# Patient Record
Sex: Female | Born: 1990 | Race: Black or African American | Hispanic: No | Marital: Single | Smoking: Never smoker
Health system: Southern US, Community
[De-identification: ages and names within clinical notes are randomized; demographics above are authoritative.]

## PROBLEM LIST (undated history)

## (undated) DIAGNOSIS — D649 Anemia, unspecified: Secondary | ICD-10-CM

---

## 2017-11-21 ENCOUNTER — Other Ambulatory Visit: Payer: Self-pay

## 2017-11-21 NOTE — Telephone Encounter (Signed)
Pt called no answer LM via voicemail to see if she could come in early on Monday to do an U/S before her visit with JML. Pt was advised to call the office to make an appt for U/S.

## 2017-11-24 ENCOUNTER — Encounter: Payer: Self-pay | Admitting: Certified Nurse Midwife

## 2017-11-24 ENCOUNTER — Other Ambulatory Visit: Payer: Self-pay | Admitting: Certified Nurse Midwife

## 2017-11-24 ENCOUNTER — Other Ambulatory Visit (HOSPITAL_COMMUNITY)
Admission: RE | Admit: 2017-11-24 | Discharge: 2017-11-24 | Disposition: A | Discharge status: Home / Self Care | Payer: Self-pay | Source: Ambulatory Visit | Attending: Certified Nurse Midwife | Admitting: Certified Nurse Midwife

## 2017-11-24 ENCOUNTER — Ambulatory Visit (INDEPENDENT_AMBULATORY_CARE_PROVIDER_SITE_OTHER): Payer: Self-pay | Admitting: Certified Nurse Midwife

## 2017-11-24 ENCOUNTER — Ambulatory Visit (INDEPENDENT_AMBULATORY_CARE_PROVIDER_SITE_OTHER): Payer: Self-pay

## 2017-11-24 ENCOUNTER — Telehealth: Payer: Self-pay | Admitting: Certified Nurse Midwife

## 2017-11-24 VITALS — BP 120/77 | HR 83 | Ht 63.0 in | Wt 137.0 lb

## 2017-11-24 DIAGNOSIS — N926 Irregular menstruation, unspecified: Secondary | ICD-10-CM

## 2017-11-24 DIAGNOSIS — Z3491 Encounter for supervision of normal pregnancy, unspecified, first trimester: Secondary | ICD-10-CM | POA: Insufficient documentation

## 2017-11-24 DIAGNOSIS — Z3A11 11 weeks gestation of pregnancy: Secondary | ICD-10-CM

## 2017-11-24 DIAGNOSIS — O3680X0 Pregnancy with inconclusive fetal viability, not applicable or unspecified: Secondary | ICD-10-CM

## 2017-11-24 LAB — POCT URINALYSIS DIPSTICK
BILIRUBIN UA: NEGATIVE
Blood, UA: NEGATIVE
GLUCOSE UA: NEGATIVE
Ketones, UA: NEGATIVE
LEUKOCYTES UA: NEGATIVE
Nitrite, UA: NEGATIVE
Protein, UA: POSITIVE — AB
Spec Grav, UA: 1.015 (ref 1.010–1.025)
Urobilinogen, UA: 1 E.U./dL
pH, UA: 6 (ref 5.0–8.0)

## 2017-11-24 NOTE — Progress Notes (Signed)
NEW OB HISTORY AND PHYSICAL  SUBJECTIVE:       Teresa Wiggins is a 27 y.o. G2P0010 female, No LMP recorded. Patient is pregnant., Estimated Date of Delivery: 06/10/18, 537w5d, presents today for establishment of Prenatal Care.  She has no unusual complaints. Endorses nausea with weekly vomiting and breast tenderness. Denies difficulty breathing or respiratory distress, chest pain, abdominal pain, vaginal bleeding, dysuria, and leg pain or swelling.   She is taking a prenatal visit. Desires genetic screening.    Gynecologic History  No LMP recorded. Patient is pregnant.  Last Pap: unsure.  Obstetric History  OB History  Gravida Para Term Preterm AB Living  2       1    SAB TAB Ectopic Multiple Live Births    1          # Outcome Date GA Lbr Len/2nd Weight Sex Delivery Anes PTL Lv  2 Current           1 TAB             History reviewed. No pertinent past medical history.  History reviewed. No pertinent surgical history.   Social History   Socioeconomic History  . Marital status: Single    Spouse name: Not on file  . Number of children: Not on file  . Years of education: Not on file  . Highest education level: Not on file  Occupational History  . Not on file  Social Needs  . Financial resource strain: Not on file  . Food insecurity:    Worry: Not on file    Inability: Not on file  . Transportation needs:    Medical: Not on file    Non-medical: Not on file  Tobacco Use  . Smoking status: Never Smoker  . Smokeless tobacco: Never Used  Substance and Sexual Activity  . Alcohol use: Never    Frequency: Never  . Drug use: Never  . Sexual activity: Yes    Birth control/protection: None  Lifestyle  . Physical activity:    Days per week: Not on file    Minutes per session: Not on file  . Stress: Not on file  Relationships  . Social connections:    Talks on phone: Not on file    Gets together: Not on file    Attends religious service: Not on file    Active  member of club or organization: Not on file    Attends meetings of clubs or organizations: Not on file    Relationship status: Not on file  . Intimate partner violence:    Fear of current or ex partner: Not on file    Emotionally abused: Not on file    Physically abused: Not on file    Forced sexual activity: Not on file  Other Topics Concern  . Not on file  Social History Narrative  . Not on file    History reviewed. No pertinent family history.  The following portions of the patient's history were reviewed and updated as appropriate: allergies, current medications, past OB history, past medical history, past surgical history, past family history, past social history, and problem list.   OBJECTIVE:  BP 120/77   Pulse 83   Ht 5\' 3"  (1.6 m)   Wt 137 lb (62.1 kg)   BMI 24.27 kg/m   Initial Physical Exam (New OB)  GENERAL APPEARANCE: alert, well appearing, in no apparent distress  HEAD: normocephalic, atraumatic  MOUTH: mucous membranes moist, pharynx normal without lesions  THYROID: no thyromegaly or masses present  BREASTS: no masses noted, no significant tenderness, no palpable axillary nodes, no skin changes  LUNGS: clear to auscultation, no wheezes, rales or rhonchi, symmetric air entry  HEART: regular rate and rhythm, no murmurs  ABDOMEN: soft, nontender, nondistended, no abnormal masses, no epigastric pain  EXTREMITIES: no redness or tenderness in the calves or thighs, no edema  SKIN: normal coloration and turgor, no rashes  LYMPH NODES: no adenopathy palpable  NEUROLOGIC: alert, oriented, normal speech, no focal findings or movement disorder noted  PELVIC EXAM EXTERNAL GENITALIA: normal appearing vulva with no masses, tenderness or lesions VAGINA: no abnormal discharge or lesions CERVIX: no lesions or cervical motion tenderness and Pap collected UTERUS: gravid and consistent with 11 weeks ADNEXA: no masses palpable and nontender OB EXAM PELVIMETRY:  appears adequate  ULTRASOUND REPORT  Location: [facility] Date of Service:  11/24/2017  Indications: Dating/Viability Findings:  Mason Jim intrauterine pregnancy is visualized with a CRL consistent with 11 5/[redacted] weeks gestation, giving an (U/S) EDD of 06/10/18. A clinical EDD has not been established.  FHR: 152 BPM CRL measurement: 49.5 mm Yolk sac and early anatomy is normal.  Placenta visualized and appears to be anterior.  Right Ovary measures 3.5 x 2.4 x 2.2 cm. It is normal in appearance. Left Ovary measures 2.8 x 2.3 x 1.6 cm. It is normal appearance. There is no obvious evidence of a corpus luteal cyst. Survey of the adnexa demonstrates no adnexal masses. There is no free peritoneal fluid in the cul de sac.  Impression: 1. 11 5/7 week Viable Singleton Intrauterine pregnancy by U/S. 2. A clinical EDD has not been established, so today's ultrasound should be used.  Recommendations: 1.Clinical correlation with the patient's History and Physical Exam. 2. Today's ultrasound should be used to establish EDD of 06/10/18.  ASSESSMENT: Normal pregnancy Nausea and vomiting in pregnancy Desires genetic screening  PLAN: Prenatal care [place] samples given New OB counseling: The patient has been given an overview regarding routine prenatal care. Recommendations regarding diet, weight gain, and exercise in pregnancy were given. Prenatal testing, optional genetic testing, and ultrasound use in pregnancy were reviewed.  Benefits of Breast Feeding were discussed. The patient is encouraged to consider nursing her baby post partum. See orders   Gunnar Bulla, CNM [facility], [facility]

## 2017-11-24 NOTE — Telephone Encounter (Signed)
The patient came into the office and stated that she missed work due to her being extremely nauseous, Her employer needs a note for missed time out. "If possible the patient would like a note faxed to the human resources department of her employer. That fax number is [phone]. The patient would also like a call back as well. Please advise.

## 2017-11-24 NOTE — Patient Instructions (Signed)
Vaginal Bleeding During Pregnancy, First Trimester A small amount of bleeding (spotting) from the vagina is common in early pregnancy. Sometimes the bleeding is normal and is not a problem, and sometimes it is a sign of something serious. Be sure to tell your doctor about any bleeding from your vagina right away. Follow these instructions at home:  Watch your condition for any changes.  Follow your doctor's instructions about how active you can be.  If you are on bed rest: ? You may need to stay in bed and only get up to use the bathroom. ? You may be allowed to do some activities. ? If you need help, make plans for someone to help you.  Write down: ? The number of pads you use each day. ? How often you change pads. ? How soaked (saturated) your pads are.  Do not use tampons.  Do not douche.  Do not have sex or orgasms until your doctor says it is okay.  If you pass any tissue from your vagina, save the tissue so you can show it to your doctor.  Only take medicines as told by your doctor.  Do not take aspirin because it can make you bleed.  Keep all follow-up visits as told by your doctor. Contact a doctor if:  You bleed from your vagina.  You have cramps.  You have labor pains.  You have a fever that does not go away after you take medicine. Get help right away if:  You have very bad cramps in your back or belly (abdomen).  You pass large clots or tissue from your vagina.  You bleed more.  You feel light-headed or weak.  You pass out (faint).  You have chills.  You are leaking fluid or have a gush of fluid from your vagina.  You pass out while pooping (having a bowel movement). This information is not intended to replace advice given to you by your health care provider. Make sure you discuss any questions you have with your health care provider. Document Released: 09/27/2013 Document Revised: 10/19/2015 Document Reviewed: 01/18/2013 Elsevier Interactive  Patient Education  2018 [place]. Abdominal Pain During Pregnancy Belly (abdominal) pain is common during pregnancy. Most of the time, it is not a serious problem. Other times, it can be a sign that something is wrong with the pregnancy. Always tell your doctor if you have belly pain. Follow these instructions at home: Monitor your belly pain for any changes. The following actions may help you feel better:  Do not have sex (intercourse) or put anything in your vagina until you feel better.  Rest until your pain stops.  Drink clear fluids if you feel sick to your stomach (nauseous). Do not eat solid food until you feel better.  Only take medicine as told by your doctor.  Keep all doctor visits as told.  Get help right away if:  You are bleeding, leaking fluid, or pieces of tissue come out of your vagina.  You have more pain or cramping.  You keep throwing up (vomiting).  You have pain when you pee (urinate) or have blood in your pee.  You have a fever.  You do not feel your baby moving as much.  You feel very weak or feel like passing out.  You have trouble breathing, with or without belly pain.  You have a very bad headache and belly pain.  You have fluid leaking from your vagina and belly pain.  You keep having watery  poop (diarrhea).  Your belly pain does not go away after resting, or the pain gets worse. This information is not intended to replace advice given to you by your health care provider. Make sure you discuss any questions you have with your health care provider. Document Released: 05/01/2009 Document Revised: 12/20/2015 Document Reviewed: 12/10/2012 Elsevier Interactive Patient Education  2018 [place]. Back Pain in Pregnancy Back pain during pregnancy is common. Back pain may be caused by several factors that are related to changes during your pregnancy. Follow these instructions at home: Managing pain, stiffness, and swelling  If directed,  apply ice for sudden (acute) back pain. ? Put ice in a plastic bag. ? Place a towel between your skin and the bag. ? Leave the ice on for 20 minutes, 2-3 times per day.  If directed, apply heat to the affected area before you exercise: ? Place a towel between your skin and the heat pack or heating pad. ? Leave the heat on for 20-30 minutes. ? Remove the heat if your skin turns bright red. This is especially important if you are unable to feel pain, heat, or cold. You may have a greater risk of getting burned. Activity  Exercise as told by your health care provider. Exercising is the best way to prevent or manage back pain.  Listen to your body when lifting. If lifting hurts, ask for help or bend your knees. This uses your leg muscles instead of your back muscles.  Squat down when picking up something from the floor. Do not bend over.  Only use bed rest as told by your health care provider. Bed rest should only be used for the most severe episodes of back pain. Standing, Sitting, and Lying Down  Do not stand in one place for long periods of time.  Use good posture when sitting. Make sure your head rests over your shoulders and is not hanging forward. Use a pillow on your lower back if necessary.  Try sleeping on your side, preferably the left side, with a pillow or two between your legs. If you are sore after a night's rest, your bed may be too soft. A firm mattress may provide more support for your back during pregnancy. General instructions  Do not wear high heels.  Eat a healthy diet. Try to gain weight within your health care provider's recommendations.  Use a maternity girdle, elastic sling, or back brace as told by your health care provider.  Take over-the-counter and prescription medicines only as told by your health care provider.  Keep all follow-up visits as told by your health care provider. This is important. This includes any visits with any specialists, such as a  physical therapist. Contact a health care provider if:  Your back pain interferes with your daily activities.  You have increasing pain in other parts of your body. Get help right away if:  You develop numbness, tingling, weakness, or problems with the use of your arms or legs.  You develop severe back pain that is not controlled with medicine.  You have a sudden change in bowel or bladder control.  You develop shortness of breath, dizziness, or you faint.  You develop nausea, vomiting, or sweating.  You have back pain that is a rhythmic, cramping pain similar to labor pains. Labor pain is usually 1-2 minutes apart, lasts for about 1 minute, and involves a bearing down feeling or pressure in your pelvis.  You have back pain and your water breaks or  you have vaginal bleeding.  You have back pain or numbness that travels down your leg.  Your back pain developed after you fell.  You develop pain on one side of your back.  You see blood in your urine.  You develop skin blisters in the area of your back pain. This information is not intended to replace advice given to you by your health care provider. Make sure you discuss any questions you have with your health care provider. Document Released: 08/21/2005 Document Revised: 10/19/2015 Document Reviewed: 01/25/2015 Elsevier Interactive Patient Education  2018 [place]. WHAT OB PATIENTS CAN EXPECT   Confirmation of pregnancy and ultrasound ordered if medically indicated-[redacted] weeks gestation  New OB (NOB) intake with nurse and New OB (NOB) labs- [redacted] weeks gestation  New OB (NOB) physical examination with provider- 11/[redacted] weeks gestation  Flu vaccine-[redacted] weeks gestation  Anatomy scan-[redacted] weeks gestation  Glucose tolerance test, blood work to test for anemia, T-dap vaccine-[redacted] weeks gestation  Vaginal swabs/cultures-STD/Group B strep-[redacted] weeks gestation  Appointments every 4 weeks until 28 weeks  Every 2 weeks from 28 weeks  until 36 weeks  Weekly visits from 36 weeks until delivery  Morning Sickness Morning sickness is when you feel sick to your stomach (nauseous) during pregnancy. You may feel sick to your stomach and throw up (vomit). You may feel sick in the morning, but you can feel this way any time of day. Some women feel very sick to their stomach and cannot stop throwing up (hyperemesis gravidarum). Follow these instructions at home:  Only take medicines as told by your doctor.  Take multivitamins as told by your doctor. Taking multivitamins before getting pregnant can stop or lessen the harshness of morning sickness.  Eat dry toast or unsalted crackers before getting out of bed.  Eat 5 to 6 small meals a day.  Eat dry and bland foods like rice and baked potatoes.  Do not drink liquids with meals. Drink between meals.  Do not eat greasy, fatty, or spicy foods.  Have someone cook for you if the smell of food causes you to feel sick or throw up.  If you feel sick to your stomach after taking prenatal vitamins, take them at night or with a snack.  Eat protein when you need a snack (nuts, yogurt, cheese).  Eat unsweetened gelatins for dessert.  Wear a bracelet used for sea sickness (acupressure wristband).  Go to a doctor that puts thin needles into certain body points (acupuncture) to improve how you feel.  Do not smoke.  Use a humidifier to keep the air in your house free of odors.  Get lots of fresh air. Contact a doctor if:  You need medicine to feel better.  You feel dizzy or lightheaded.  You are losing weight. Get help right away if:  You feel very sick to your stomach and cannot stop throwing up.  You pass out (faint). This information is not intended to replace advice given to you by your health care provider. Make sure you discuss any questions you have with your health care provider. Document Released: 06/20/2004 Document Revised: 10/19/2015 Document Reviewed:  10/28/2012 Elsevier Interactive Patient Education  2017 [place] for Pregnant Women While you are pregnant, your body will require additional nutrition to help support your growing baby. It is recommended that you consume:  150 additional calories each day during your first trimester.  300 additional calories each day during your second trimester.  300 additional calories each  day during your third trimester.  Eating a healthy, well-balanced diet is very important for your health and for your baby's health. You also have a higher need for some vitamins and minerals, such as folic acid, calcium, iron, and vitamin D. What do I need to know about eating during pregnancy?  Do not try to lose weight or go on a diet during pregnancy.  Choose healthy, nutritious foods. Choose  of a sandwich with a glass of milk instead of a candy bar or a high-calorie sugar-sweetened beverage.  Limit your overall intake of foods that have "empty calories." These are foods that have little nutritional value, such as sweets, desserts, candies, sugar-sweetened beverages, and fried foods.  Eat a variety of foods, especially fruits and vegetables.  Take a prenatal vitamin to help meet the additional needs during pregnancy, specifically for folic acid, iron, calcium, and vitamin D.  Remember to stay active. Ask your health care provider for exercise recommendations that are specific to you.  Practice good food safety and cleanliness, such as washing your hands before you eat and after you prepare raw meat. This helps to prevent foodborne illnesses, such as listeriosis, that can be very dangerous for your baby. Ask your health care provider for more information about listeriosis. What does 150 extra calories look like? Healthy options for an additional 150 calories each day could be any of the following:  Plain low-fat yogurt (6-8 oz) with  cup of berries.  1 apple with 2 teaspoons of peanut  butter.  Cut-up vegetables with  cup of hummus.  Low-fat chocolate milk (8 oz or 1 cup).  1 string cheese with 1 medium orange.   of a peanut butter and jelly sandwich on whole-wheat bread (1 tsp of peanut butter).  For 300 calories, you could eat two of those healthy options each day. What is a healthy amount of weight to gain? The recommended amount of weight for you to gain is based on your pre-pregnancy BMI. If your pre-pregnancy BMI was:  Less than 18 (underweight), you should gain 28-40 lb.  18-24.9 (normal), you should gain 25-35 lb.  25-29.9 (overweight), you should gain 15-25 lb.  Greater than 30 (obese), you should gain 11-20 lb.  What if I am having twins or multiples? Generally, pregnant women who will be having twins or multiples may need to increase their daily calories by 300-600 calories each day. The recommended range for total weight gain is 25-54 lb, depending on your pre-pregnancy BMI. Talk with your health care provider for specific guidance about additional nutritional needs, weight gain, and exercise during your pregnancy. What foods can I eat? Grains Any grains. Try to choose whole grains, such as whole-wheat bread, oatmeal, or brown rice. Vegetables Any vegetables. Try to eat a variety of colors and types of vegetables to get a full range of vitamins and minerals. Remember to wash your vegetables well before eating. Fruits Any fruits. Try to eat a variety of colors and types of fruit to get a full range of vitamins and minerals. Remember to wash your fruits well before eating. Meats and Other Protein Sources Lean meats, including chicken, [place], fish, and lean cuts of beef, veal, or pork. Make sure that all meats are cooked to "well done." Tofu. Tempeh. Beans. Eggs. Peanut butter and other nut butters. Seafood, such as shrimp, crab, and lobster. If you choose fish, select types that are higher in omega-3 fatty acids, including salmon, herring, mussels,  trout, sardines, and pollock. Make  sure that all meats are cooked to food-safe temperatures. Dairy Pasteurized milk and milk alternatives. Pasteurized yogurt and pasteurized cheese. Cottage cheese. Sour cream. Beverages Water. Juices that contain 100% fruit juice or vegetable juice. Caffeine-free teas and decaffeinated coffee. Drinks that contain caffeine are okay to drink, but it is better to avoid caffeine. Keep your total caffeine intake to less than 200 mg each day (12 oz of coffee, tea, or soda) or as directed by your health care provider. Condiments Any pasteurized condiments. Sweets and Desserts Any sweets and desserts. Fats and Oils Any fats and oils. The items listed above may not be a complete list of recommended foods or beverages. Contact your dietitian for more options. What foods are not recommended? Vegetables Unpasteurized (raw) vegetable juices. Fruits Unpasteurized (raw) fruit juices. Meats and Other Protein Sources Cured meats that have nitrates, such as bacon, salami, and hotdogs. Luncheon meats, bologna, or other deli meats (unless they are reheated until they are steaming hot). Refrigerated pate, meat spreads from a meat counter, smoked seafood that is found in the refrigerated section of a store. Raw fish, such as sushi or sashimi. High mercury content fish, such as tilefish, shark, swordfish, and king mackerel. Raw meats, such as tuna or beef tartare. Undercooked meats and poultry. Make sure that all meats are cooked to food-safe temperatures. Dairy Unpasteurized (raw) milk and any foods that have raw milk in them. Soft cheeses, such as feta, queso blanco, queso fresco, Brie, Camembert cheeses, blue-veined cheeses, and Panela cheese (unless it is made with pasteurized milk, which must be stated on the label). Beverages Alcohol. Sugar-sweetened beverages, such as sodas, teas, or energy drinks. Condiments Homemade fermented foods and drinks, such as pickles, sauerkraut,  or kombucha drinks. (Store-bought pasteurized versions of these are okay.) Other Salads that are made in the store, such as ham salad, chicken salad, egg salad, tuna salad, and seafood salad. The items listed above may not be a complete list of foods and beverages to avoid. Contact your dietitian for more information. This information is not intended to replace advice given to you by your health care provider. Make sure you discuss any questions you have with your health care provider. Document Released: 02/25/2014 Document Revised: 10/19/2015 Document Reviewed: 10/26/2013 Elsevier Interactive Patient Education  2018 [employer]. Common Medications Safe in Pregnancy  Acne:      Constipation:  Benzoyl Peroxide     Colace  Clindamycin      Dulcolax Suppository  Topica Erythromycin     Fibercon  Salicylic Acid      Metamucil         Miralax AVOID:        Senakot   Accutane    Cough:  Retin-A       Cough Drops  Tetracycline      Phenergan w/ Codeine if Rx  Minocycline      Robitussin (Plain & DM)  Antibiotics:     Crabs/Lice:  Ceclor       RID  Cephalosporins    AVOID:  E-Mycins      Kwell  Keflex  Macrobid/Macrodantin   Diarrhea:  Penicillin      Kao-Pectate  Zithromax      Imodium AD         PUSH FLUIDS AVOID:       Cipro     Fever:  Tetracycline      Tylenol (Regular or Extra  Minocycline       Strength)  Levaquin  Extra Strength-Do not          Exceed 8 tabs/24 hrs Caffeine:        <250m/day (equiv. To 1 cup of coffee or  approx. 3 12 oz sodas)         Gas: Cold/Hayfever:       Gas-X  Benadryl      Mylicon  Claritin       Phazyme  **Claritin-D        Chlor-Trimeton    Headaches:  Dimetapp      ASA-Free Excedrin  Drixoral-Non-Drowsy     Cold Compress  Mucinex (Guaifenasin)     Tylenol (Regular or Extra  Sudafed/Sudafed-12 Hour     Strength)  **Sudafed PE Pseudoephedrine   Tylenol Cold & Sinus     Vicks Vapor Rub  Zyrtec  **AVOID if Problems With Blood  Pressure         Heartburn: Avoid lying down for at least 1 hour after meals  Aciphex      Maalox     Rash:  Milk of Magnesia     Benadryl    Mylanta       1% Hydrocortisone Cream  Pepcid  Pepcid Complete   Sleep Aids:  Prevacid      Ambien   Prilosec       Benadryl  Rolaids       Chamomile Tea  Tums (Limit 4/day)     Unisom  Zantac       Tylenol PM         Warm milk-add vanilla or  Hemorrhoids:       Sugar for taste  Anusol/Anusol H.C.  (RX: Analapram 2.5%)  Sugar Substitutes:  Hydrocortisone OTC     Ok in moderation  Preparation H      Tucks        Vaseline lotion applied to tissue with wiping    Herpes:     Throat:  Acyclovir      Oragel  Famvir  Valtrex     Vaccines:         Flu Shot Leg Cramps:       *Gardasil  Benadryl      Hepatitis A         Hepatitis B Nasal Spray:       Pneumovax  Saline Nasal Spray     Polio Booster         Tetanus Nausea:       Tuberculosis test or PPD  Vitamin B6 25 mg TID   AVOID:    Dramamine      *Gardasil  Emetrol       Live Poliovirus  Ginger Root 250 mg QID    MMR (measles, mumps &  High Complex Carbs @ Bedtime    rebella)  Sea Bands-Accupressure    Varicella (Chickenpox)  Unisom 1/2 tab TID     *No known complications           If received before Pain:         Known pregnancy;   Darvocet       Resume series after  Lortab        Delivery  Percocet    Yeast:   Tramadol      Femstat  Tylenol 3      Gyne-lotrimin  Ultram       Monistat  Vicodin           MISC:  All Sunscreens           Hair Coloring/highlights          Insect Repellant's          (Including DEET)         Mystic Tans

## 2017-11-24 NOTE — Progress Notes (Signed)
Pt is present today for confirmation of pregnancy. U/S dating done today [id number].  Teresa Wiggins presents for NOB nurse intake visit. Pregnancy confirmation done at [facility],November 24, 2017, with JML.  G2.  P0010.  LMP unknown.  EDD Jun 10, 2018.  [place]. Pregnancy education material explained and given.  1 cats in the home.  NOB labs ordered. BMI less than 30. Sickle cell order due to race. HIV and drug screen explained and ordered/declined. Genetic screening discussed. Genetic testing; Ordered. Pt to discuss genetic testing with provider. PNV encouraged. Pt to follow up with provider in 4 weeks for ROB.

## 2017-11-25 ENCOUNTER — Encounter: Payer: Self-pay | Admitting: Certified Nurse Midwife

## 2017-11-25 DIAGNOSIS — Z832 Family history of diseases of the blood and blood-forming organs and certain disorders involving the immune mechanism: Secondary | ICD-10-CM | POA: Insufficient documentation

## 2017-11-25 LAB — URINALYSIS, ROUTINE W REFLEX MICROSCOPIC
Bilirubin, UA: NEGATIVE
GLUCOSE, UA: NEGATIVE
KETONES UA: NEGATIVE
Leukocytes, UA: NEGATIVE
NITRITE UA: NEGATIVE
PROTEIN UA: NEGATIVE
RBC, UA: NEGATIVE
SPEC GRAV UA: 1.02 (ref 1.005–1.030)
Urobilinogen, Ur: 1 mg/dL (ref 0.2–1.0)
pH, UA: 6 (ref 5.0–7.5)

## 2017-11-25 LAB — CBC WITH DIFFERENTIAL/PLATELET
BASOS: 0 %
Basophils Absolute: 0 10*3/uL (ref 0.0–0.2)
EOS (ABSOLUTE): 0 10*3/uL (ref 0.0–0.4)
EOS: 1 %
Hematocrit: 35.2 % (ref 34.0–46.6)
Hemoglobin: 12 g/dL (ref 11.1–15.9)
IMMATURE GRANS (ABS): 0 10*3/uL (ref 0.0–0.1)
Immature Granulocytes: 0 %
LYMPHS: 15 %
Lymphocytes Absolute: 1.3 10*3/uL (ref 0.7–3.1)
MCH: 26.5 pg — ABNORMAL LOW (ref 26.6–33.0)
MCHC: 34.1 g/dL (ref 31.5–35.7)
MCV: 78 fL — AB (ref 79–97)
Monocytes Absolute: 0.7 10*3/uL (ref 0.1–0.9)
Monocytes: 9 %
NEUTROS PCT: 75 %
Neutrophils Absolute: 6.5 10*3/uL (ref 1.4–7.0)
PLATELETS: 243 10*3/uL (ref 150–450)
RBC: 4.52 x10E6/uL (ref 3.77–5.28)
RDW: 13.1 % (ref 12.3–15.4)
WBC: 8.6 10*3/uL (ref 3.4–10.8)

## 2017-11-25 LAB — ABO AND RH: Rh Factor: POSITIVE

## 2017-11-25 LAB — TOXOPLASMA ANTIBODIES- IGG AND  IGM: Toxoplasma IgG Ratio: 68.5 IU/mL — ABNORMAL HIGH (ref 0.0–7.1)

## 2017-11-25 LAB — BETA HCG QUANT (REF LAB): hCG Quant: 199989 m[IU]/mL

## 2017-11-25 LAB — VARICELLA ZOSTER ANTIBODY, IGG: VARICELLA: 368 {index} (ref >165)

## 2017-11-25 LAB — RUBELLA SCREEN: RUBELLA: 1.98 {index} (ref >0.99)

## 2017-11-25 LAB — HEPATITIS B SURFACE ANTIGEN: HEP B S AG: NEGATIVE

## 2017-11-25 LAB — RPR: RPR Ser Ql: NONREACTIVE

## 2017-11-25 LAB — HIV ANTIBODY (ROUTINE TESTING W REFLEX): HIV SCREEN 4TH GENERATION: NONREACTIVE

## 2017-11-25 LAB — SICKLE CELL SCREEN: Sickle Cell Screen: POSITIVE — AB

## 2017-11-25 NOTE — Progress Notes (Signed)
Please contact patient. FOB will need sickle cell labs as well. Still awaiting rest of NOB labs. Encourage to activate MyChart. Thanks, JML

## 2017-11-26 ENCOUNTER — Telehealth: Payer: Self-pay

## 2017-11-26 LAB — DRUG PROFILE, UR, 9 DRUGS (LABCORP)
AMPHETAMINES, URINE: NEGATIVE ng/mL
BENZODIAZEPINE QUANT UR: NEGATIVE ng/mL
Barbiturate Quant, Ur: NEGATIVE ng/mL
CANNABINOID QUANT UR: NEGATIVE ng/mL
Cocaine (Metab.): NEGATIVE ng/mL
METHADONE SCREEN, URINE: NEGATIVE ng/mL
Opiate Quant, Ur: NEGATIVE ng/mL
PCP QUANT UR: NEGATIVE ng/mL
Propoxyphene: NEGATIVE ng/mL

## 2017-11-26 LAB — NICOTINE SCREEN, URINE: COTININE UR QL SCN: NEGATIVE ng/mL

## 2017-11-26 LAB — CYTOLOGY - PAP
CHLAMYDIA, DNA PROBE: NEGATIVE
DIAGNOSIS: NEGATIVE
Neisseria Gonorrhea: NEGATIVE

## 2017-11-26 NOTE — Telephone Encounter (Signed)
Message sent to ML.

## 2017-11-27 LAB — URINE CULTURE, OB REFLEX

## 2017-11-27 LAB — CULTURE, OB URINE

## 2017-12-01 ENCOUNTER — Telehealth: Payer: Self-pay | Admitting: Certified Nurse Midwife

## 2017-12-01 NOTE — Telephone Encounter (Signed)
May have a note this time. Patient must contact office immediately next time to receive note. Encourage to apply for FMLA if available. Thanks, JML

## 2017-12-01 NOTE — Progress Notes (Signed)
Please contact Pap is negative or normal. Encourage to activate MyChart. Thanks, JML

## 2017-12-01 NOTE — Telephone Encounter (Signed)
The patient called and stated that she would like to know the cost of the (Sickle Cell Testing) for the baby. No other information was disclosed. Please advise.

## 2017-12-02 ENCOUNTER — Encounter: Payer: Self-pay | Admitting: *Deleted

## 2017-12-02 NOTE — Telephone Encounter (Signed)
Sent pt mychart message

## 2017-12-07 ENCOUNTER — Other Ambulatory Visit: Payer: Self-pay

## 2017-12-07 DIAGNOSIS — O210 Mild hyperemesis gravidarum: Secondary | ICD-10-CM | POA: Insufficient documentation

## 2017-12-07 DIAGNOSIS — Z3A13 13 weeks gestation of pregnancy: Secondary | ICD-10-CM | POA: Insufficient documentation

## 2017-12-07 LAB — CBC
HCT: 36 % (ref 35.0–47.0)
HEMOGLOBIN: 12.4 g/dL (ref 12.0–16.0)
MCH: 26.8 pg (ref 26.0–34.0)
MCHC: 34.3 g/dL (ref 32.0–36.0)
MCV: 78.2 fL — ABNORMAL LOW (ref 80.0–100.0)
Platelets: 243 10*3/uL (ref 150–440)
RBC: 4.61 MIL/uL (ref 3.80–5.20)
RDW: 12.5 % (ref 11.5–14.5)
WBC: 11.3 10*3/uL — ABNORMAL HIGH (ref 3.6–11.0)

## 2017-12-07 LAB — BASIC METABOLIC PANEL
ANION GAP: 12 (ref 5–15)
BUN: 8 mg/dL (ref 6–20)
CALCIUM: 9.8 mg/dL (ref 8.9–10.3)
CO2: 22 mmol/L (ref 22–32)
Chloride: 102 mmol/L (ref 98–111)
Creatinine, Ser: 0.58 mg/dL (ref 0.44–1.00)
GFR calc Af Amer: >60 mL/min (ref >60)
GFR calc non Af Amer: >60 mL/min (ref >60)
GLUCOSE: 114 mg/dL — AB (ref 70–99)
Potassium: 3.3 mmol/L — ABNORMAL LOW (ref 3.5–5.1)
Sodium: 136 mmol/L (ref 135–145)

## 2017-12-07 LAB — LIPASE, BLOOD: Lipase: 36 U/L (ref 11–51)

## 2017-12-07 NOTE — ED Notes (Signed)
Pt found to be eating ice chips upon rounding, pt tolerating well.

## 2017-12-07 NOTE — ED Triage Notes (Signed)
Patient reports vomiting x 8 today and being unable to keep anything down.  Also reports feeling dizzy.

## 2017-12-08 ENCOUNTER — Emergency Department
Admission: EM | Admit: 2017-12-08 | Discharge: 2017-12-08 | Disposition: A | Discharge status: Home / Self Care | Payer: Self-pay | Attending: Emergency Medicine | Admitting: Emergency Medicine

## 2017-12-08 ENCOUNTER — Emergency Department: Payer: Self-pay

## 2017-12-08 DIAGNOSIS — O210 Mild hyperemesis gravidarum: Secondary | ICD-10-CM

## 2017-12-08 LAB — URINALYSIS, COMPLETE (UACMP) WITH MICROSCOPIC
Bilirubin Urine: NEGATIVE
GLUCOSE, UA: NEGATIVE mg/dL
Hgb urine dipstick: NEGATIVE
Ketones, ur: 80 mg/dL — AB
Nitrite: NEGATIVE
PROTEIN: 30 mg/dL — AB
Specific Gravity, Urine: 1.017 (ref 1.005–1.030)
pH: 5 (ref 5.0–8.0)

## 2017-12-08 LAB — HCG, QUANTITATIVE, PREGNANCY: HCG, BETA CHAIN, QUANT, S: 247904 m[IU]/mL — AB (ref <5)

## 2017-12-08 LAB — POCT PREGNANCY, URINE: PREG TEST UR: POSITIVE — AB

## 2017-12-08 MED ORDER — SODIUM CHLORIDE 0.9 % IV BOLUS
1000.0000 mL | Freq: Once | INTRAVENOUS | Status: AC
  Administered 2017-12-08: 1000 mL via INTRAVENOUS

## 2017-12-08 MED ORDER — ONDANSETRON HCL 4 MG/2ML IJ SOLN
4.0000 mg | Freq: Once | INTRAMUSCULAR | Status: AC
  Administered 2017-12-08: 4 mg via INTRAVENOUS
  Filled 2017-12-08: qty 2

## 2017-12-08 MED ORDER — ONDANSETRON 4 MG PO TBDP: 4.0000 mg | ORAL_TABLET | Freq: Three times a day (TID) | ORAL | 0 refills | Status: DC | PRN

## 2017-12-08 NOTE — ED Notes (Signed)
Pt up to desk, says she knows she can't eat or drink anything but she'd like jello; explained to pt that jello is food and she can't have anything until cleared by provider; pt verbalized understanding;

## 2017-12-08 NOTE — ED Provider Notes (Addendum)
[facility] Emergency Department Provider Note _   First MD Initiated Contact with Patient 12/08/17 [phone]     (approximate)  I have reviewed the triage vital signs and the nursing notes.   HISTORY  Chief Complaint Emesis and Dizziness   HPI Teresa Wiggins is a 27 y.o. female G3P02 elective abortions presents to the emergency department with nausea vomiting and inability to tolerate p.o. x1 day.  Patient denies any abdominal pain no pelvic pain no vaginal bleeding.  Patient denies any vaginal discharge.  Patient states that she was prescribed a "nausea medicine" from her OB without any improvement of nausea.   Past medical history 2 elective abortions  Patient Active Problem List   Diagnosis Date Noted  . Family history of sickle cell trait in mother 11/25/2017    No past surgical history on file.  Prior to Admission medications   Not on File    Allergies No known drug allergies No family history on file.  Social History Social History   Tobacco Use  . Smoking status: Never Smoker  . Smokeless tobacco: Never Used  Substance Use Topics  . Alcohol use: Never    Frequency: Never  . Drug use: Never    Review of Systems Constitutional: No fever/chills Eyes: No visual changes. ENT: No sore throat. Cardiovascular: Denies chest pain. Respiratory: Denies shortness of breath. Gastrointestinal: No abdominal pain.  Positive for nausea and vomiting no diarrhea.  No constipation. Genitourinary: Negative for dysuria. Musculoskeletal: Negative for neck pain.  Negative for back pain. Integumentary: Negative for rash. Neurological: Negative for headaches, focal weakness or numbness.   ____________________________________________   PHYSICAL EXAM:  VITAL SIGNS: ED Triage Vitals  Enc Vitals Group     BP 12/07/17 1920 119/72     Pulse Rate 12/07/17 1920 (!) 114     Resp 12/07/17 1920 20     Temp 12/07/17 1920 97.9 F (36.6 C)     Temp  Source 12/07/17 2359 Oral     SpO2 12/07/17 1920 100 %     Weight --      Height --      Head Circumference --      Peak Flow --      Pain Score 12/07/17 1921 0     Pain Loc --      Pain Edu? --      Excl. in GC? --     Constitutional: Alert and oriented. Well appearing and in no acute distress. Eyes: Conjunctivae are normal.  Head: Atraumatic. Mouth/Throat: Mucous membranes are moist.  Oropharynx non-erythematous. Neck: No stridor.   Cardiovascular: Normal rate, regular rhythm. Good peripheral circulation. Grossly normal heart sounds. Respiratory: Normal respiratory effort.  No retractions. Lungs CTAB. Gastrointestinal: Soft and nontender. No distention.  Musculoskeletal: No lower extremity tenderness nor edema. No gross deformities of extremities. Neurologic:  Normal speech and language. No gross focal neurologic deficits are appreciated.  Skin:  Skin is warm, dry and intact. No rash noted. Psychiatric: Mood and affect are normal. Speech and behavior are normal.  ____________________________________________   LABS (all labs ordered are listed, but only abnormal results are displayed)  Labs Reviewed  BASIC METABOLIC PANEL - Abnormal; Notable for the following components:      Result Value   Potassium 3.3 (*)    Glucose, Bld 114 (*)    All other components within normal limits  CBC - Abnormal; Notable for the following components:   WBC 11.3 (*)  MCV 78.2 (*)    All other components within normal limits  URINALYSIS, COMPLETE (UACMP) WITH MICROSCOPIC - Abnormal; Notable for the following components:   Color, Urine YELLOW (*)    APPearance HAZY (*)    Ketones, ur 80 (*)    Protein, ur 30 (*)    Leukocytes, UA TRACE (*)    Bacteria, UA RARE (*)    All other components within normal limits  HCG, QUANTITATIVE, PREGNANCY - Abnormal; Notable for the following components:   hCG, Beta Chain, Quant, S [id number] (*)    All other components within normal limits  POCT PREGNANCY,  URINE - Abnormal; Notable for the following components:   Preg Test, Ur POSITIVE (*)    All other components within normal limits  LIPASE, BLOOD  CBG MONITORING, ED  POC URINE PREG, ED    RADIOLOGY I, Pisek N BROWN, personally viewed and evaluated these images (plain radiographs) as part of my medical decision making, as well as reviewing the written report by the radiologist.  ED MD interpretation: Live intrauterine pregnancy 13 weeks 5 days.  Official radiology report(s): [place] Ob Comp Less 14 Wks  Result Date: 12/08/2017 CLINICAL DATA:  27 year old pregnant female with pelvic pain. EXAM: OBSTETRIC <14 WK ULTRASOUND TECHNIQUE: Transabdominal ultrasound was performed for evaluation of the gestation as well as the maternal uterus and adnexal regions. COMPARISON:  Ultrasound dated 11/24/2017 FINDINGS: Intrauterine gestational sac: None Yolk sac:  Visualized. Embryo:  Visualized. Cardiac Activity: Visualized. Heart Rate: 153 bpm CRL: 77 mm   13 w 5 d                  US EDC: 06/10/2018 Subchorionic hemorrhage:  None Maternal uterus/adnexae: The right ovary is not visualized. The left ovary appears unremarkable and measures 4.2 x 2.2 x 2.1 cm. There is a 9 x 8 x 11 mm corpus luteum. No free fluid within the pelvis. IMPRESSION: Single live intrauterine pregnancy with an estimated gestational age of [redacted] weeks, 5 days based on today's crown-rump length concordant with age based on ultrasound of 11/24/2017. Electronically Signed   By: Elgie CollardArash  Radparvar M.D.   On: 12/08/2017 03:56    ED ECG REPORT I, Lantana N BROWN, the attending physician, personally viewed and interpreted this ECG.   Date: 12/07/2017  EKG Time: 7:29 PM  Rate: 95  Rhythm: Normal sinus rhythm  Axis: Normal  Intervals: Normal  ST&T Change: None   Procedures   ____________________________________________   INITIAL IMPRESSION / ASSESSMENT AND PLAN / ED COURSE  As part of my medical decision making, I reviewed the following  data within the electronic MEDICAL RECORD NUMBER   27 year old female presenting with above-stated history and physical exam secondary to nausea and vomiting consistent with hyperemesis gravidarum.  Patient given 2 L IV normal saline IV Zofran with no further vomiting while in the emergency department.  Patient will be prescribed Phenergan for home with recommendation to follow-up with OB today    ____________________________________________  FINAL CLINICAL IMPRESSION(S) / ED DIAGNOSES  Final diagnoses:  Hyperemesis gravidarum     MEDICATIONS GIVEN DURING THIS VISIT:  Medications  ondansetron (ZOFRAN) injection 4 mg (4 mg Intravenous Given 12/08/17 0220)  sodium chloride 0.9 % bolus 1,000 mL (1,000 mLs Intravenous New Bag/Given 12/08/17 0219)     ED Discharge Orders    None       Note:  This document was prepared using Dragon voice recognition software and may include unintentional dictation errors.  Darci Current, MD 12/08/17 [id number]    Darci Current, MD 12/21/17 [phone]

## 2017-12-12 ENCOUNTER — Ambulatory Visit (INDEPENDENT_AMBULATORY_CARE_PROVIDER_SITE_OTHER): Payer: Self-pay | Admitting: Certified Nurse Midwife

## 2017-12-12 ENCOUNTER — Encounter: Payer: Self-pay | Admitting: Certified Nurse Midwife

## 2017-12-12 VITALS — BP 105/68 | HR 93 | Wt 134.4 lb

## 2017-12-12 DIAGNOSIS — O219 Vomiting of pregnancy, unspecified: Secondary | ICD-10-CM

## 2017-12-12 MED ORDER — ONDANSETRON 4 MG PO TBDP: 4.0000 mg | ORAL_TABLET | Freq: Three times a day (TID) | ORAL | 2 refills | Status: DC | PRN

## 2017-12-12 MED ORDER — PROMETHAZINE HCL 25 MG RE SUPP: 25.0000 mg | Freq: Four times a day (QID) | RECTAL | 0 refills | Status: DC | PRN

## 2017-12-12 NOTE — Patient Instructions (Signed)

## 2017-12-12 NOTE — Progress Notes (Signed)
Pt here for follow up From ED for N&V in pregnancy. Discussed N&V and avoidance of triggers. Encouraged small bland frequent meals. Use of ginger or sea bands. She state she used sample of [place] that did not work. Prescription for Zofran and phenergan suppositories today with instructions on use. Pt verbalizes understanding and agrees to plan . Red flag symptoms reviewed. Follow up as scheduled for ROB.   Doreene BurkeAnnie Jadon Harbaugh, CNM

## 2017-12-12 NOTE — Progress Notes (Signed)
Pt is here for a followup from ED visit for dehydration.

## 2017-12-12 NOTE — Progress Notes (Signed)
Pt here for follow up vist.

## 2017-12-24 ENCOUNTER — Encounter: Payer: Self-pay | Admitting: Certified Nurse Midwife

## 2017-12-24 ENCOUNTER — Ambulatory Visit: Payer: Self-pay | Admitting: Certified Nurse Midwife

## 2017-12-24 VITALS — BP 117/69 | HR 90 | Wt 137.4 lb

## 2017-12-24 DIAGNOSIS — Z3402 Encounter for supervision of normal first pregnancy, second trimester: Secondary | ICD-10-CM

## 2017-12-24 LAB — POCT URINALYSIS DIPSTICK
Bilirubin, UA: NEGATIVE
Blood, UA: NEGATIVE
Glucose, UA: NEGATIVE
Ketones, UA: NEGATIVE
LEUKOCYTES UA: NEGATIVE
NITRITE UA: NEGATIVE
Protein, UA: POSITIVE — AB
SPEC GRAV UA: 1.015 (ref 1.010–1.025)
Urobilinogen, UA: 2 E.U./dL — AB
pH, UA: 7.5 (ref 5.0–8.0)

## 2017-12-24 NOTE — Progress Notes (Signed)
Pt is here for an ROB visit. 

## 2017-12-24 NOTE — Patient Instructions (Signed)

## 2017-12-24 NOTE — Addendum Note (Signed)
Addended by: Brooke DareSICK, Cowan Pilar L on: 12/24/2017 09:33 AM   Modules accepted: Orders

## 2017-12-24 NOTE — Progress Notes (Signed)
ROB, Doing well. Nausea is better. Discussed anatomy u/s at next visit . Reviewed round ligament pain. Follow up 4 wks.   Doreene BurkeAnnie Rechel Delosreyes, CNM

## 2018-01-06 ENCOUNTER — Encounter: Payer: Self-pay | Admitting: Emergency Medicine

## 2018-01-06 ENCOUNTER — Emergency Department
Admission: EM | Admit: 2018-01-06 | Discharge: 2018-01-06 | Disposition: A | Discharge status: Home / Self Care | Payer: Self-pay | Attending: Student in an Organized Health Care Education/Training Program | Admitting: Student in an Organized Health Care Education/Training Program

## 2018-01-06 DIAGNOSIS — O368121 Decreased fetal movements, second trimester, fetus 1: Secondary | ICD-10-CM | POA: Insufficient documentation

## 2018-01-06 DIAGNOSIS — Z3A17 17 weeks gestation of pregnancy: Secondary | ICD-10-CM | POA: Insufficient documentation

## 2018-01-06 DIAGNOSIS — O368120 Decreased fetal movements, second trimester, not applicable or unspecified: Secondary | ICD-10-CM

## 2018-01-06 NOTE — ED Provider Notes (Signed)
[facility] Emergency Department Provider Note    First MD Initiated Contact with Patient 01/06/18 1725     (approximate)  I have reviewed the triage vital signs and the nursing notes.   HISTORY  Chief Complaint no fetal movement    HPI Park Meoathalie Terlecki is a 27 y.o. female is roughly [redacted] weeks pregnant presents the ER because she is concerned that she has not felt fetal movement.  She is told by her OB/GYN that she should be feeling the baby moving by now and she became very anxious and concerned because she has not felt anything yet.  No vaginal bleeding or discharge.  No abdominal pain.  History reviewed. No pertinent past medical history. No family history on file. History reviewed. No pertinent surgical history. Patient Active Problem List   Diagnosis Date Noted  . Family history of sickle cell trait in mother 11/25/2017      Prior to Admission medications   Medication Sig Start Date End Date Taking? Authorizing Provider  Prenatal Vit-Fe Fumarate-FA (MULTIVITAMIN-PRENATAL) 27-0.8 MG TABS tablet Take 1 tablet by mouth daily at 12 noon.    [provider]    Allergies Patient has no known allergies.    Social History Social History   Tobacco Use  . Smoking status: Never Smoker  . Smokeless tobacco: Never Used  Substance Use Topics  . Alcohol use: Never    Frequency: Never  . Drug use: Never    Review of Systems Patient denies headaches, rhinorrhea, blurry vision, numbness, shortness of breath, chest pain, edema, cough, abdominal pain, nausea, vomiting, diarrhea, dysuria, fevers, rashes or hallucinations unless otherwise stated above in HPI. ____________________________________________   PHYSICAL EXAM:  VITAL SIGNS: Vitals:   01/06/18 1707  BP: 117/75  Pulse: 100  Resp: 16  Temp: 97.7 F (36.5 C)  SpO2: 100%    Constitutional: Alert and oriented. Well appearing and in no acute distress. Eyes: Conjunctivae are  normal.  Head: Atraumatic. Nose: No congestion/rhinnorhea. Mouth/Throat: Mucous membranes are moist.   Neck: Painless ROM.  Cardiovascular:   Good peripheral circulation. Respiratory: Normal respiratory effort.  No retractions.  Gastrointestinal: Soft and nontender.  Musculoskeletal: No lower extremity tenderness .  No joint effusions. Neurologic:  Normal speech and language. No gross focal neurologic deficits are appreciated.  Skin:  Skin is warm, dry and intact. No rash noted. Psychiatric: Mood and affect are normal. Speech and behavior are normal.  ____________________________________________   LABS (all labs ordered are listed, but only abnormal results are displayed)  No results found for this or any previous visit (from the past 24 hour(s)). ____________________________________________ ____________________________________________  RADIOLOGY  EMERGENCY DEPARTMENT US PREGNANCY "Study: Limited Ultrasound of the Pelvis for Pregnancy"  INDICATIONS:Pregnancy(required) Multiple views of the uterus and pelvic cavity were obtained in real-time with a multi-frequency probe.  APPROACH:Transabdominal  PERFORMED BY: Myself IMAGES ARCHIVED?: Yes LIMITATIONS: none PREGNANCY FREE FLUID: Present ADNEXAL FINDINGS: GESTATIONAL AGE, ESTIMATE: 17 FETAL HEART RATE: 150 INTERPRETATION: Fetal heart activity seen     ____________________________________________   PROCEDURES  Procedure(s) performed:  Procedures    Critical Care performed: no ____________________________________________   INITIAL IMPRESSION / ASSESSMENT AND PLAN / ED COURSE  Pertinent labs & imaging results that were available during my care of the patient were reviewed by me and considered in my medical decision making (see chart for details).  DDX: Miscarriage, ectopic, will check  Park Meoathalie Ruppert is a 27 y.o. who presents to the ED with symptoms as described above.  Patient afebrile Heema dynamically  stable.  Bedside ultrasound shows reassuring fetal activity and cardiac activity.  Patient reassured.  Encouraged to follow-up with PCP.      ____________________________________________   FINAL CLINICAL IMPRESSION(S) / ED DIAGNOSES  Final diagnoses:  [redacted] weeks gestation of pregnancy  Decreased fetal movements in second trimester, single or unspecified fetus      NEW MEDICATIONS STARTED DURING THIS VISIT:  New Prescriptions   No medications on file     Note:  This document was prepared using Dragon voice recognition software and may include unintentional dictation errors.     Willy Eddyobinson, Laurissa Cowper, MD 01/06/18 1759

## 2018-01-06 NOTE — ED Triage Notes (Signed)
Pt was sent by OBGYN due to no fetal movement felt. PT reports being [redacted] weeks pregnant. This is patients first pregnancy. Pt denies any pain or spotting.

## 2018-01-22 ENCOUNTER — Other Ambulatory Visit: Payer: Self-pay

## 2018-01-22 ENCOUNTER — Encounter: Payer: Self-pay | Admitting: Certified Nurse Midwife

## 2018-01-27 ENCOUNTER — Ambulatory Visit (INDEPENDENT_AMBULATORY_CARE_PROVIDER_SITE_OTHER): Payer: Federal, State, Local not specified - PPO | Admitting: Certified Nurse Midwife

## 2018-01-27 ENCOUNTER — Ambulatory Visit (INDEPENDENT_AMBULATORY_CARE_PROVIDER_SITE_OTHER): Payer: Federal, State, Local not specified - PPO

## 2018-01-27 ENCOUNTER — Other Ambulatory Visit: Payer: Self-pay

## 2018-01-27 ENCOUNTER — Encounter: Payer: Self-pay | Admitting: Certified Nurse Midwife

## 2018-01-27 VITALS — BP 107/68 | HR 76 | Wt 145.3 lb

## 2018-01-27 DIAGNOSIS — Z363 Encounter for antenatal screening for malformations: Secondary | ICD-10-CM | POA: Diagnosis not present

## 2018-01-27 DIAGNOSIS — Z23 Encounter for immunization: Secondary | ICD-10-CM

## 2018-01-27 DIAGNOSIS — Z3402 Encounter for supervision of normal first pregnancy, second trimester: Secondary | ICD-10-CM

## 2018-01-27 NOTE — Progress Notes (Signed)
ROB doing well. Discussed u/s results. Reviewed perception of fetal movement with anterior placenta. Pt verbalizes understanding. She is interested in water birth. Pt encouraged to take class. Position statements for ACNM and ACOG given, dula information given. Pt to follow up in 4 wks   Doreene Burke, CNM  ULTRASOUND REPORT  Location: [facility] Date of Service:  01/27/2018  Indications: Anatomy Findings:  Mason Jim intrauterine pregnancy is visualized with FHR at 144 BPM. Biometrics give an (U/S) Gestational age of 59 5/7 weeks and an (U/S) EDD of 06/11/18; this correlates with the clinically established EDD of 06/10/18.  Fetal presentation is breech.  EFW: 383 grams (0lb 14oz). Placenta: Anterior and grade 0. AFI: WNL subjectively.  Anatomic survey is complete and appears WNL; Gender - Female.   Right Ovary measures 2.6 x 2.1 x 1.5 cm. It is normal in appearance. Left Ovary measures 2.2 x 1.8 x 1.3 cm. It is normal appearance. There is no obvious evidence of a corpus luteal cyst. Survey of the adnexa demonstrates no adnexal masses. There is no free peritoneal fluid in the cul de sac.  Impression: 1. 20 5/7 week Viable Singleton Intrauterine pregnancy by U/S. 2. (U/S) EDD is consistent with Clinically established (LMP) EDD of 06/10/18. 3. Normal Anatomy Scan  Recommendations: 1.Clinical correlation with the patient's History and Physical Exam.  Kari Baars, RDMS

## 2018-01-27 NOTE — Patient Instructions (Addendum)
Round Ligament Pain The round ligament is a cord of muscle and tissue that helps to support the uterus. It can become a source of pain during pregnancy if it becomes stretched or twisted as the baby grows. The pain usually begins in the second trimester of pregnancy, and it can come and go until the baby is delivered. It is not a serious problem, and it does not cause harm to the baby. Round ligament pain is usually a short, sharp, and pinching pain, but it can also be a dull, lingering, and aching pain. The pain is felt in the lower side of the abdomen or in the groin. It usually starts deep in the groin and moves up to the outside of the hip area. Pain can occur with:  A sudden change in position.  Rolling over in bed.  Coughing or sneezing.  Physical activity.  Follow these instructions at home: Watch your condition for any changes. Take these steps to help with your pain:  When the pain starts, relax. Then try: ? Sitting down. ? Flexing your knees up to your abdomen. ? Lying on your side with one pillow under your abdomen and another pillow between your legs. ? Sitting in a warm bath for 15-20 minutes or until the pain goes away.  Take over-the-counter and prescription medicines only as told by your health care provider.  Move slowly when you sit and stand.  Avoid long walks if they cause pain.  Stop or lessen your physical activities if they cause pain.  Contact a health care provider if:  Your pain does not go away with treatment.  You feel pain in your back that you did not have before.  Your medicine is not helping. Get help right away if:  You develop a fever or chills.  You develop uterine contractions.  You develop vaginal bleeding.  You develop nausea or vomiting.  You develop diarrhea.  You have pain when you urinate. This information is not intended to replace advice given to you by your health care provider. Make sure you discuss any questions you have  with your health care provider. Document Released: 02/20/2008 Document Revised: 10/19/2015 Document Reviewed: 07/20/2014 Elsevier Interactive Patient Education  [address] [place] Class  November 06, 2016  Wednesday 7:00p - 9:00p  [facility] [place], [place]  December 11, 2016  Wednesday 7:00p - 9:00p [facility] [place], [place]    January 15, 2017   Wednesday 7:00p - 9:000p [facility] [place], [place]  February 12, 2017  Wednesday  7:00p - 9:00p [facility] [place], [place]  March 12, 2017 Wednesday 7:00p - 9:00p [facility] [place], [place]  Interested in a waterbirth?  This informational class will help you discover whether waterbirth is the right fit for you.  Education about waterbirth itself, supplies you would need and how to assemble your support team is what you can expect from this class.  Some obstetrical practices require this class in order to pursue a waterbirth.  (Not all obstetrical practices offer waterbirth check with your healthcare provider)  Register only the expectant mom, but you are encouraged to bring your partner to class!  Fees & Payment No fee  Register Online [email or web address]  Search [place]

## 2018-01-28 ENCOUNTER — Other Ambulatory Visit: Payer: Self-pay

## 2018-01-28 MED ORDER — VITAFOL FE+ 90-1-200 & 50 MG PO CPPK: 1.0000 | ORAL_CAPSULE | Freq: Every day | ORAL | 6 refills | Status: DC

## 2018-02-11 ENCOUNTER — Encounter: Payer: Self-pay | Admitting: Certified Nurse Midwife

## 2018-02-24 ENCOUNTER — Ambulatory Visit (INDEPENDENT_AMBULATORY_CARE_PROVIDER_SITE_OTHER): Payer: Federal, State, Local not specified - PPO | Admitting: Obstetrics and Gynecology

## 2018-02-24 VITALS — BP 102/69 | HR 93 | Wt 143.7 lb

## 2018-02-24 DIAGNOSIS — Z3492 Encounter for supervision of normal pregnancy, unspecified, second trimester: Secondary | ICD-10-CM

## 2018-02-24 DIAGNOSIS — O26899 Other specified pregnancy related conditions, unspecified trimester: Secondary | ICD-10-CM

## 2018-02-24 DIAGNOSIS — R12 Heartburn: Secondary | ICD-10-CM

## 2018-02-24 NOTE — Patient Instructions (Signed)
Heartburn During Pregnancy Heartburn is pain or discomfort in the throat or chest. It may cause a burning feeling. It happens when stomach acid moves up into the tube that carries food from your mouth to your stomach (esophagus). Heartburn is common during pregnancy. It usually goes away or gets better after giving birth. Follow these instructions at home: Eating and drinking  Do not drink alcohol while you are pregnant.  Figure out which foods and beverages make you feel worse, and avoid them.  Beverages that you may want to avoid include: ? Coffee and tea (with or without caffeine). ? Energy drinks and sports drinks. ? Bubbly (carbonated) drinks or sodas. ? Citrus fruit juices.  Foods that you may want to avoid include: ? Chocolate and cocoa. ? Peppermint and mint flavorings. ? Garlic, onions, and horseradish. ? Spicy and acidic foods. These include peppers, chili powder, curry powder, vinegar, hot sauces, and barbecue sauce. ? Citrus fruits, such as oranges, lemons, and limes. ? Tomato-based foods, such as red sauce, chili, and salsa. ? Fried and fatty foods, such as donuts, french fries, potato chips, and high-fat dressings. ? High-fat meats, such as hot dogs, cold cuts, sausage, ham, and bacon. ? High-fat dairy items, such as whole milk, butter, and cheese.  Eat small meals often, instead of large meals.  Avoid drinking a lot of liquid with your meals.  Avoid eating meals during the 2-3 hours before you go to bed.  Avoid lying down right after you eat.  Do not exercise right after you eat. Medicines  Take over-the-counter and prescription medicines only as told by your doctor.  Do not take aspirin, ibuprofen, or other NSAIDs unless your doctor tells you to do that.  Your doctor may tell you to avoid medicines that have sodium bicarbonate in them. General instructions  If told, raise the head of your bed about 6 inches (15 cm). You can do this by putting blocks under  the legs. Sleeping with more pillows does not help with heartburn.  Do not use any products that contain nicotine or tobacco, such as cigarettes and e-cigarettes. If you need help quitting, ask your doctor.  Wear loose-fitting clothing.  Try to lower your stress, such as with yoga or meditation. If you need help, ask your doctor.  Stay at a healthy weight. If you are overweight, work with your doctor to safely lose weight.  Keep all follow-up visits as told by your doctor. This is important. Contact a doctor if:  You get new symptoms.  Your symptoms do not get better with treatment.  You have weight loss and you do not know why.  You have trouble swallowing.  You make loud sounds when you breathe (wheeze).  You have a cough that does not go away.  You have heartburn often for more than 2 weeks.  You feel sick to your stomach (nauseous), and this does not get better with treatment.  You are throwing up (vomiting), and this does not get better with treatment.  You have pain in your belly (abdomen). Get help right away if:  You have very bad chest pain that spreads to your arm, neck, or jaw.  You feel sweaty, dizzy, or light-headed.  You have trouble breathing.  You have pain when swallowing.  You throw up and your throw-up looks like blood or coffee grounds.  Your poop (stool) is bloody or black. This information is not intended to replace advice given to you by your health care provider.   Make sure you discuss any questions you have with your health care provider. Document Released: 06/15/2010 Document Revised: 01/29/2016 Document Reviewed: 01/29/2016 Elsevier Interactive Patient Education  2017 Elsevier Inc.  

## 2018-02-24 NOTE — Progress Notes (Signed)
ROB-doing better with hyperemesis. Does report random episodes of heartburn. Counseled on TUMs and zantac use. Counseled on partner needing sickle cell testing- will try to get him here at next visit(he is a long distance truck driver) samples of vitafol Fe given.

## 2018-02-24 NOTE — Progress Notes (Signed)
ROB- pt is doing well 

## 2018-03-12 ENCOUNTER — Telehealth: Payer: Self-pay | Admitting: Certified Nurse Midwife

## 2018-03-12 ENCOUNTER — Telehealth: Payer: Self-pay

## 2018-03-12 NOTE — Telephone Encounter (Signed)
See mychart messages- off work slip for 03/11/18 written and is in envelope up front. Pt aware.

## 2018-03-12 NOTE — Telephone Encounter (Signed)
The patient called and stated that she was extremely dizzy at work yesterday and passed out, The ambulance came and told the patient that she was dehydrated. The patient would like to know what do do next due to her being told she needs to f/u. Please advise.

## 2018-03-24 ENCOUNTER — Encounter: Payer: Self-pay | Admitting: Certified Nurse Midwife

## 2018-03-24 ENCOUNTER — Ambulatory Visit (INDEPENDENT_AMBULATORY_CARE_PROVIDER_SITE_OTHER): Payer: Federal, State, Local not specified - PPO | Admitting: Certified Nurse Midwife

## 2018-03-24 ENCOUNTER — Other Ambulatory Visit: Payer: Federal, State, Local not specified - PPO

## 2018-03-24 VITALS — BP 107/75 | HR 111 | Wt 146.0 lb

## 2018-03-24 DIAGNOSIS — Z3492 Encounter for supervision of normal pregnancy, unspecified, second trimester: Secondary | ICD-10-CM

## 2018-03-24 LAB — POCT URINALYSIS DIPSTICK OB
Bilirubin, UA: NEGATIVE
Glucose, UA: NEGATIVE
KETONES UA: NEGATIVE
Leukocytes, UA: NEGATIVE
Nitrite, UA: NEGATIVE
RBC UA: NEGATIVE
SPEC GRAV UA: 1.015 (ref 1.010–1.025)
UROBILINOGEN UA: 1 U/dL
pH, UA: 6 (ref 5.0–8.0)

## 2018-03-24 MED ORDER — TETANUS-DIPHTH-ACELL PERTUSSIS 5-2.5-18.5 LF-MCG/0.5 IM SUSP
0.5000 mL | Freq: Once | INTRAMUSCULAR | Status: AC
  Administered 2018-03-24: 0.5 mL via INTRAMUSCULAR

## 2018-03-24 NOTE — Patient Instructions (Signed)
Glucose Tolerance Test During Pregnancy The glucose tolerance test (GTT) is a blood test used to determine if you have developed a type of diabetes during pregnancy (gestational diabetes). This is when your body does not properly process sugar (glucose) in the food you eat, resulting in high blood glucose levels. Typically, a GTT is done after you have had a 1-hour glucose test with results that indicate you possibly have gestational diabetes. It may also be done if:  You have a history of giving birth to very large babies or have experienced repeated fetal loss (stillbirth).  You have signs and symptoms of diabetes, such as: ? Changes in your vision. ? Tingling or numbness in your hands or feet. ? Changes in hunger, thirst, and urination not otherwise explained by your pregnancy.  The GTT lasts about 3 hours. You will be given a sugar-water solution to drink at the beginning of the test. You will have blood drawn before you drink the solution and then again 1, 2, and 3 hours after you drink it. You will not be allowed to eat or drink anything else during the test. You must remain at the testing location to make sure that your blood is drawn on time. You should also avoid exercising during the test, because exercise can alter test results. How do I prepare for this test? Eat normally for 3 days prior to the GTT test, including having plenty of carbohydrate-rich foods. Do not eat or drink anything except water during the final 12 hours before the test. In addition, your health care provider may ask you to stop taking certain medicines before the test. What do the results mean? It is your responsibility to obtain your test results. Ask the lab or department performing the test when and how you will get your results. Contact your health care provider to discuss any questions you have about your results. Range of Normal Values Ranges for normal values may vary among different labs and hospitals. You  should always check with your health care provider after having lab work or other tests done to discuss whether your values are considered within normal limits. Normal levels of blood glucose are as follows:  Fasting: less than 105 mg/dL.  1 hour after drinking the solution: less than 190 mg/dL.  2 hours after drinking the solution: less than 165 mg/dL.  3 hours after drinking the solution: less than 145 mg/dL.  Some substances can interfere with GTT results. These may include:  Blood pressure and heart failure medicines, including beta blockers, furosemide, and thiazides.  Anti-inflammatory medicines, including aspirin.  Nicotine.  Some psychiatric medicines.  Meaning of Results Outside Normal Value Ranges GTT test results that are above normal values may indicate a number of health problems, such as:  Gestational diabetes.  Acute stress response.  Cushing syndrome.  Tumors such as pheochromocytoma or glucagonoma.  Long-term kidney problems.  Pancreatitis.  Hyperthyroidism.  Current infection.  Discuss your test results with your health care provider. He or she will use the results to make a diagnosis and determine a treatment plan that is right for you. This information is not intended to replace advice given to you by your health care provider. Make sure you discuss any questions you have with your health care provider. Document Released: 11/12/2011 Document Revised: 10/19/2015 Document Reviewed: 09/17/2013 Elsevier Interactive Patient Education  2018 [employer] Td Vaccine (Tetanus and Diphtheria): What You Need to Know 1. Why get vaccinated? Tetanus  and diphtheria are very serious   diseases. They are rare in the [place] today, but people who do become infected often have severe complications. Td vaccine is used to protect adolescents and adults from both of these diseases. Both tetanus and diphtheria are infections caused by bacteria. Diphtheria spreads  from person to person through coughing or sneezing. Tetanus-causing bacteria enter the body through cuts, scratches, or wounds. TETANUS (lockjaw) causes painful muscle tightening and stiffness, usually all over the body.  It can lead to tightening of muscles in the head and neck so you can't open your mouth, swallow, or sometimes even breathe. Tetanus kills about 1 out of every 10 people who are infected even after receiving the best medical care.  DIPHTHERIA can cause a thick coating to form in the back of the throat.  It can lead to breathing problems, paralysis, heart failure, and death.  Before vaccines, as many as 200,000 cases of diphtheria and hundreds of cases of tetanus were reported in the [place] each year. Since vaccination began, reports of cases for both diseases have dropped by about 99%. 2. Td vaccine Td vaccine can protect adolescents and adults from tetanus and diphtheria. Td is usually given as a booster dose every 10 years but it can also be given earlier after a severe and dirty wound or burn. Another vaccine, called Tdap, which protects against pertussis in addition to tetanus and diphtheria, is sometimes recommended instead of Td vaccine. Your doctor or the person giving you the vaccine can give you more information. Td may safely be given at the same time as other vaccines. 3. Some people should not get this vaccine  A person who has ever had a life-threatening allergic reaction after a previous dose of any tetanus or diphtheria containing vaccine, OR has a severe allergy to any part of this vaccine, should not get Td vaccine. Tell the person giving the vaccine about any severe allergies.  Talk to your doctor if you: ? had severe pain or swelling after any vaccine containing diphtheria or tetanus, ? ever had a condition called Guillain Barre Syndrome (GBS), ? aren't feeling well on the day the shot is scheduled. 4. What are the risks from Td vaccine? With any  medicine, including vaccines, there is a chance of side effects. These are usually mild and go away on their own. Serious reactions are also possible but are rare. Most people who get Td vaccine do not have any problems with it. Mild problems following Td vaccine: (Did not interfere with activities)  Pain where the shot was given (about 8 people in 10)  Redness or swelling where the shot was given (about 1 person in 4)  Mild fever (rare)  Headache (about 1 person in 4)  Tiredness (about 1 person in 4)  Moderate problems following Td vaccine: (Interfered with activities, but did not require medical attention)  Fever over 102F (rare)  Severe problems following Td vaccine: (Unable to perform usual activities; required medical attention)  Swelling, severe pain, bleeding and/or redness in the arm where the shot was given (rare).  Problems that could happen after any vaccine:  People sometimes faint after a medical procedure, including vaccination. Sitting or lying down for about 15 minutes can help prevent fainting, and injuries caused by a fall. Tell your doctor if you feel dizzy, or have vision changes or ringing in the ears.  Some people get severe pain in the shoulder and have difficulty moving the arm where a shot was given. This happens very rarely.    Any medication can cause a severe allergic reaction. Such reactions from a vaccine are very rare, estimated at fewer than 1 in a million doses, and would happen within a few minutes to a few hours after the vaccination. As with any medicine, there is a very remote chance of a vaccine causing a serious injury or death. The safety of vaccines is always being monitored. For more information, visit: www.cdc.gov/vaccinesafety/ 5. What if there is a serious reaction? What should I look for? Look for anything that concerns you, such as signs of a severe allergic reaction, very high fever, or unusual behavior. Signs of a severe allergic  reaction can include hives, swelling of the face and throat, difficulty breathing, a fast heartbeat, dizziness, and weakness. These would usually start a few minutes to a few hours after the vaccination. What should I do?  If you think it is a severe allergic reaction or other emergency that can't wait, call 9-1-1 or get the person to the nearest hospital. Otherwise, call your doctor.  Afterward, the reaction should be reported to the Vaccine Adverse Event Reporting System (VAERS). Your doctor might file this report, or you can do it yourself through the VAERS web site at www.vaers.hhs.gov, or by calling 1-800-822-7967. ? VAERS does not give medical advice. 6. The National Vaccine Injury Compensation Program The National Vaccine Injury Compensation Program (VICP) is a federal program that was created to compensate people who may have been injured by certain vaccines. Persons who believe they may have been injured by a vaccine can learn about the program and about filing a claim by calling 1-800-338-2382 or visiting the VICP website at www.hrsa.gov/vaccinecompensation. There is a time limit to file a claim for compensation. 7. How can I learn more?  Ask your doctor. He or she can give you the vaccine package insert or suggest other sources of information.  Call your local or state health department.  Contact the Centers for Disease Control and Prevention (CDC): ? Call 1-800-232-4636 (1-800-CDC-INFO) ? Visit CDC's website at www.cdc.gov/vaccines CDC Td Vaccine VIS (09/05/15) This information is not intended to replace advice given to you by your health care provider. Make sure you discuss any questions you have with your health care provider. Document Released: 03/10/2006 Document Revised: 02/01/2016 Document Reviewed: 02/01/2016 Elsevier Interactive Patient Education  2017 Elsevier Inc.  

## 2018-03-24 NOTE — Progress Notes (Signed)
ROB doing well. Feels good movement. Discussed round ligament pain. Glucose screen/Tdap/RPR/CBC/BTC today. Discussed birth control after baby. Pamphlet given. Reviewed round ligament pain. PT state she completed water birth class. She will bring in the certificate next appointment. Will follow up with Lab results. Return OB in 2 wks. With Melody.   Doreene Burke, CNM

## 2018-03-25 ENCOUNTER — Other Ambulatory Visit: Payer: Self-pay | Admitting: Certified Nurse Midwife

## 2018-03-25 LAB — CBC
Hematocrit: 28.1 % — ABNORMAL LOW (ref 34.0–46.6)
Hemoglobin: 9.7 g/dL — ABNORMAL LOW (ref 11.1–15.9)
MCH: 27.3 pg (ref 26.6–33.0)
MCHC: 34.5 g/dL (ref 31.5–35.7)
MCV: 79 fL (ref 79–97)
PLATELETS: 221 10*3/uL (ref 150–450)
RBC: 3.55 x10E6/uL — ABNORMAL LOW (ref 3.77–5.28)
RDW: 13.2 % (ref 12.3–15.4)
WBC: 9.6 10*3/uL (ref 3.4–10.8)

## 2018-03-25 LAB — GLUCOSE, 1 HOUR GESTATIONAL: Gestational Diabetes Screen: 85 mg/dL (ref 65–139)

## 2018-03-25 LAB — RPR: RPR: NONREACTIVE

## 2018-03-25 MED ORDER — FUSION PLUS PO CAPS: 1.0000 | ORAL_CAPSULE | Freq: Every day | ORAL | 6 refills | Status: DC

## 2018-04-07 ENCOUNTER — Encounter: Payer: Self-pay | Admitting: Certified Nurse Midwife

## 2018-04-07 ENCOUNTER — Ambulatory Visit (INDEPENDENT_AMBULATORY_CARE_PROVIDER_SITE_OTHER): Payer: Federal, State, Local not specified - PPO | Admitting: Certified Nurse Midwife

## 2018-04-07 VITALS — BP 104/64 | HR 90 | Wt 149.1 lb

## 2018-04-07 DIAGNOSIS — Z3493 Encounter for supervision of normal pregnancy, unspecified, third trimester: Secondary | ICD-10-CM

## 2018-04-07 DIAGNOSIS — D649 Anemia, unspecified: Secondary | ICD-10-CM | POA: Insufficient documentation

## 2018-04-07 NOTE — Progress Notes (Signed)
ROB- pt is doing well 

## 2018-04-07 NOTE — Progress Notes (Signed)
ROB-Doing well. Waterbirth consent reviewed and signed today. Copy of class completion certificate placed in chart. Anticipatory guidance regarding course of prenatal care. Discussed local doula, tub rental, and tub management options. Reviewed red flag symptoms and when to call. RTC x 2 weeks for ROB or sooner if needed.

## 2018-04-07 NOTE — Patient Instructions (Signed)
WHAT OB PATIENTS CAN EXPECT   Confirmation of pregnancy and ultrasound ordered if medically indicated-[redacted] weeks gestation  New OB (NOB) intake with nurse and New OB (NOB) labs- [redacted] weeks gestation  New OB (NOB) physical examination with provider- 11/[redacted] weeks gestation  Flu vaccine-[redacted] weeks gestation  Anatomy scan-[redacted] weeks gestation  Glucose tolerance test, blood work to test for anemia, T-dap vaccine-[redacted] weeks gestation  Vaginal swabs/cultures-STD/Group B strep-[redacted] weeks gestation  Appointments every 4 weeks until 28 weeks  Every 2 weeks from 28 weeks until 36 weeks  Weekly visits from 36 weeks until delivery  Round Ligament Pain The round ligament is a cord of muscle and tissue that helps to support the uterus. It can become a source of pain during pregnancy if it becomes stretched or twisted as the baby grows. The pain usually begins in the second trimester of pregnancy, and it can come and go until the baby is delivered. It is not a serious problem, and it does not cause harm to the baby. Round ligament pain is usually a short, sharp, and pinching pain, but it can also be a dull, lingering, and aching pain. The pain is felt in the lower side of the abdomen or in the groin. It usually starts deep in the groin and moves up to the outside of the hip area. Pain can occur with:  A sudden change in position.  Rolling over in bed.  Coughing or sneezing.  Physical activity.  Follow these instructions at home: Watch your condition for any changes. Take these steps to help with your pain:  When the pain starts, relax. Then try: ? Sitting down. ? Flexing your knees up to your abdomen. ? Lying on your side with one pillow under your abdomen and another pillow between your legs. ? Sitting in a warm bath for 15-20 minutes or until the pain goes away.  Take over-the-counter and prescription medicines only as told by your health care provider.  Move slowly when you sit and stand.  Avoid  long walks if they cause pain.  Stop or lessen your physical activities if they cause pain.  Contact a health care provider if:  Your pain does not go away with treatment.  You feel pain in your back that you did not have before.  Your medicine is not helping. Get help right away if:  You develop a fever or chills.  You develop uterine contractions.  You develop vaginal bleeding.  You develop nausea or vomiting.  You develop diarrhea.  You have pain when you urinate. This information is not intended to replace advice given to you by your health care provider. Make sure you discuss any questions you have with your health care provider. Document Released: 02/20/2008 Document Revised: 10/19/2015 Document Reviewed: 07/20/2014 Elsevier Interactive Patient Education  2018 [employer]. Back Pain in Pregnancy Back pain during pregnancy is common. Back pain may be caused by several factors that are related to changes during your pregnancy. Follow these instructions at home: Managing pain, stiffness, and swelling  If directed, apply ice for sudden (acute) back pain. ? Put ice in a plastic bag. ? Place a towel between your skin and the bag. ? Leave the ice on for 20 minutes, 2-3 times per day.  If directed, apply heat to the affected area before you exercise: ? Place a towel between your skin and the heat pack or heating pad. ? Leave the heat on for 20-30 minutes. ? Remove the heat if your  skin turns bright red. This is especially important if you are unable to feel pain, heat, or cold. You may have a greater risk of getting burned. Activity  Exercise as told by your health care provider. Exercising is the best way to prevent or manage back pain.  Listen to your body when lifting. If lifting hurts, ask for help or bend your knees. This uses your leg muscles instead of your back muscles.  Squat down when picking up something from the floor. Do not bend over.  Only use bed rest  as told by your health care provider. Bed rest should only be used for the most severe episodes of back pain. Standing, Sitting, and Lying Down  Do not stand in one place for long periods of time.  Use good posture when sitting. Make sure your head rests over your shoulders and is not hanging forward. Use a pillow on your lower back if necessary.  Try sleeping on your side, preferably the left side, with a pillow or two between your legs. If you are sore after a night's rest, your bed may be too soft. A firm mattress may provide more support for your back during pregnancy. General instructions  Do not wear high heels.  Eat a healthy diet. Try to gain weight within your health care provider's recommendations.  Use a maternity girdle, elastic sling, or back brace as told by your health care provider.  Take over-the-counter and prescription medicines only as told by your health care provider.  Keep all follow-up visits as told by your health care provider. This is important. This includes any visits with any specialists, such as a physical therapist. Contact a health care provider if:  Your back pain interferes with your daily activities.  You have increasing pain in other parts of your body. Get help right away if:  You develop numbness, tingling, weakness, or problems with the use of your arms or legs.  You develop severe back pain that is not controlled with medicine.  You have a sudden change in bowel or bladder control.  You develop shortness of breath, dizziness, or you faint.  You develop nausea, vomiting, or sweating.  You have back pain that is a rhythmic, cramping pain similar to labor pains. Labor pain is usually 1-2 minutes apart, lasts for about 1 minute, and involves a bearing down feeling or pressure in your pelvis.  You have back pain and your water breaks or you have vaginal bleeding.  You have back pain or numbness that travels down your leg.  Your back pain  developed after you fell.  You develop pain on one side of your back.  You see blood in your urine.  You develop skin blisters in the area of your back pain. This information is not intended to replace advice given to you by your health care provider. Make sure you discuss any questions you have with your health care provider. Document Released: 08/21/2005 Document Revised: 10/19/2015 Document Reviewed: 01/25/2015 Elsevier Interactive Patient Education  2018 [employer]. Common Medications Safe in Pregnancy  Acne:      Constipation:  Benzoyl Peroxide     Colace  Clindamycin      Dulcolax Suppository  Topica Erythromycin     Fibercon  Salicylic Acid      Metamucil         Miralax AVOID:        Senakot   Accutane    Cough:  Retin-A  Cough Drops  Tetracycline      Phenergan w/ Codeine if Rx  Minocycline      Robitussin (Plain & DM)  Antibiotics:     Crabs/Lice:  Ceclor       RID  Cephalosporins    AVOID:  E-Mycins      Kwell  Keflex  Macrobid/Macrodantin   Diarrhea:  Penicillin      Kao-Pectate  Zithromax      Imodium AD         PUSH FLUIDS AVOID:       Cipro     Fever:  Tetracycline      Tylenol (Regular or Extra  Minocycline       Strength)  Levaquin      Extra Strength-Do not          Exceed 8 tabs/24 hrs Caffeine:        <217m/day (equiv. To 1 cup of coffee or  approx. 3 12 oz sodas)         Gas: Cold/Hayfever:       Gas-X  Benadryl      Mylicon  Claritin       Phazyme  **Claritin-D        Chlor-Trimeton    Headaches:  Dimetapp      ASA-Free Excedrin  Drixoral-Non-Drowsy     Cold Compress  Mucinex (Guaifenasin)     Tylenol (Regular or Extra  Sudafed/Sudafed-12 Hour     Strength)  **Sudafed PE Pseudoephedrine   Tylenol Cold & Sinus     Vicks Vapor Rub  Zyrtec  **AVOID if Problems With Blood Pressure         Heartburn: Avoid lying down for at least 1 hour after meals  Aciphex      Maalox     Rash:  Milk of  Magnesia     Benadryl    Mylanta       1% Hydrocortisone Cream  Pepcid  Pepcid Complete   Sleep Aids:  Prevacid      Ambien   Prilosec       Benadryl  Rolaids       Chamomile Tea  Tums (Limit 4/day)     Unisom  Zantac       Tylenol PM         Warm milk-add vanilla or  Hemorrhoids:       Sugar for taste  Anusol/Anusol H.C.  (RX: Analapram 2.5%)  Sugar Substitutes:  Hydrocortisone OTC     Ok in moderation  Preparation H      Tucks        Vaseline lotion applied to tissue with wiping    Herpes:     Throat:  Acyclovir      Oragel  Famvir  Valtrex     Vaccines:         Flu Shot Leg Cramps:       *Gardasil  Benadryl      Hepatitis A         Hepatitis B Nasal Spray:       Pneumovax  Saline Nasal Spray     Polio Booster         Tetanus Nausea:       Tuberculosis test or PPD  Vitamin B6 25 mg TID   AVOID:    Dramamine      *Gardasil  Emetrol       Live Poliovirus  Ginger Root 250 mg QID    MMR (measles, mumps &  High Complex Carbs @ Bedtime    rebella)  Sea Bands-Accupressure    Varicella (Chickenpox)  Unisom 1/2 tab TID     *No known complications           If received before Pain:         Known pregnancy;   Darvocet       Resume series after  Lortab        Delivery  Percocet    Yeast:   Tramadol      Femstat  Tylenol 3      Gyne-lotrimin  Ultram       Monistat  Vicodin           MISC:         All Sunscreens           Hair Coloring/highlights          Insect Repellant's          (Including DEET)         Mystic Tans Third Trimester of Pregnancy The third trimester is from week 29 through week 42, months 7 through 9. This trimester is when your unborn baby (fetus) is growing very fast. At the end of the ninth month, the unborn baby is about 20 inches in length. It weighs about 6-10 pounds. Follow these instructions at home:  Avoid all smoking, herbs, and alcohol. Avoid drugs not approved by your doctor.  Do not use any tobacco products, including cigarettes, chewing  tobacco, and electronic cigarettes. If you need help quitting, ask your doctor. You may get counseling or other support to help you quit.  Only take medicine as told by your doctor. Some medicines are safe and some are not during pregnancy.  Exercise only as told by your doctor. Stop exercising if you start having cramps.  Eat regular, healthy meals.  Wear a good support bra if your breasts are tender.  Do not use hot tubs, steam rooms, or saunas.  Wear your seat belt when driving.  Avoid raw meat, uncooked cheese, and liter boxes and soil used by cats.  Take your prenatal vitamins.  Take 1500-2000 milligrams of calcium daily starting at the 20th week of pregnancy until you deliver your baby.  Try taking medicine that helps you poop (stool softener) as needed, and if your doctor approves. Eat more fiber by eating fresh fruit, vegetables, and whole grains. Drink enough fluids to keep your pee (urine) clear or pale yellow.  Take warm water baths (sitz baths) to soothe pain or discomfort caused by hemorrhoids. Use hemorrhoid cream if your doctor approves.  If you have puffy, bulging veins (varicose veins), wear support hose. Raise (elevate) your feet for 15 minutes, 3-4 times a day. Limit salt in your diet.  Avoid heavy lifting, wear low heels, and sit up straight.  Rest with your legs raised if you have leg cramps or low back pain.  Visit your dentist if you have not gone during your pregnancy. Use a soft toothbrush to brush your teeth. Be gentle when you floss.  You can have sex (intercourse) unless your doctor tells you not to.  Do not travel far distances unless you must. Only do so with your doctor's approval.  Take prenatal classes.  Practice driving to the hospital.  Pack your hospital bag.  Prepare the baby's room.  Go to your doctor visits. Get help if:  You are not sure if you are in labor or if your water has broken.  You are dizzy.  You have mild cramps or  pressure in your lower belly (abdominal).  You have a nagging pain in your belly area.  You continue to feel sick to your stomach (nauseous), throw up (vomit), or have watery poop (diarrhea).  You have bad smelling fluid coming from your vagina.  You have pain with peeing (urination). Get help right away if:  You have a fever.  You are leaking fluid from your vagina.  You are spotting or bleeding from your vagina.  You have severe belly cramping or pain.  You lose or gain weight rapidly.  You have trouble catching your breath and have chest pain.  You notice sudden or extreme puffiness (swelling) of your face, hands, ankles, feet, or legs.  You have not felt the baby move in over an hour.  You have severe headaches that do not go away with medicine.  You have vision changes. This information is not intended to replace advice given to you by your health care provider. Make sure you discuss any questions you have with your health care provider. Document Released: 08/07/2009 Document Revised: 10/19/2015 Document Reviewed: 07/14/2012 Elsevier Interactive Patient Education  2017 [employer].

## 2018-04-21 ENCOUNTER — Ambulatory Visit (INDEPENDENT_AMBULATORY_CARE_PROVIDER_SITE_OTHER): Payer: Federal, State, Local not specified - PPO | Admitting: Certified Nurse Midwife

## 2018-04-21 ENCOUNTER — Encounter: Payer: Self-pay | Admitting: Certified Nurse Midwife

## 2018-04-21 VITALS — BP 99/66 | HR 94 | Wt 148.1 lb

## 2018-04-21 DIAGNOSIS — Z3493 Encounter for supervision of normal pregnancy, unspecified, third trimester: Secondary | ICD-10-CM

## 2018-04-21 NOTE — Patient Instructions (Signed)

## 2018-04-21 NOTE — Progress Notes (Signed)
ROB doing well. Feels good movement. Discussed FMLA leave, she asked when she should start her leave. Reviewed with pt. GBS testing reviewed, informed to be completed at 36 wks. She verbalizes and agrees to plan .  Follow up 2 wks.

## 2018-04-22 ENCOUNTER — Encounter: Payer: Federal, State, Local not specified - PPO | Admitting: Certified Nurse Midwife

## 2018-05-06 ENCOUNTER — Ambulatory Visit (INDEPENDENT_AMBULATORY_CARE_PROVIDER_SITE_OTHER): Payer: Federal, State, Local not specified - PPO | Admitting: Obstetrics and Gynecology

## 2018-05-06 VITALS — BP 115/68 | HR 88 | Wt 153.2 lb

## 2018-05-06 DIAGNOSIS — Z3493 Encounter for supervision of normal pregnancy, unspecified, third trimester: Secondary | ICD-10-CM

## 2018-05-06 NOTE — Progress Notes (Signed)
ROB-pt is c/o pelvic pressure 

## 2018-05-06 NOTE — Progress Notes (Signed)
ROB- reports some muscle pains with fetus movement, and color changes around breast. Linea nigra noted.

## 2018-05-18 ENCOUNTER — Ambulatory Visit (INDEPENDENT_AMBULATORY_CARE_PROVIDER_SITE_OTHER): Payer: Federal, State, Local not specified - PPO | Admitting: Certified Nurse Midwife

## 2018-05-18 VITALS — BP 117/72 | HR 88 | Wt 151.8 lb

## 2018-05-18 DIAGNOSIS — Z3685 Encounter for antenatal screening for Streptococcus B: Secondary | ICD-10-CM

## 2018-05-18 DIAGNOSIS — O99013 Anemia complicating pregnancy, third trimester: Secondary | ICD-10-CM

## 2018-05-18 DIAGNOSIS — Z3493 Encounter for supervision of normal pregnancy, unspecified, third trimester: Secondary | ICD-10-CM

## 2018-05-18 DIAGNOSIS — Z113 Encounter for screening for infections with a predominantly sexual mode of transmission: Secondary | ICD-10-CM

## 2018-05-18 NOTE — Patient Instructions (Addendum)
Vaginal Delivery  Vaginal delivery means that you give birth by pushing your baby out of your birth canal (vagina). A team of health care providers will help you before, during, and after vaginal delivery. Birth experiences are unique for every woman and every pregnancy, and birth experiences vary depending on where you choose to give birth. What happens when I arrive at the birth center or hospital? Once you are in labor and have been admitted into the hospital or birth center, your health care provider may:  Review your pregnancy history and any concerns that you have.  Insert an IV into one of your veins. This may be used to give you fluids and medicines.  Check your blood pressure, pulse, temperature, and heart rate (vital signs).  Check whether your bag of water (amniotic sac) has broken (ruptured).  Talk with you about your birth plan and discuss pain control options. Monitoring Your health care provider may monitor your contractions (uterine monitoring) and your baby's heart rate (fetal monitoring). You may need to be monitored:  Often, but not continuously (intermittently).  All the time or for long periods at a time (continuously). Continuous monitoring may be needed if: ? You are taking certain medicines, such as medicine to relieve pain or make your contractions stronger. ? You have pregnancy or labor complications. Monitoring may be done by:  Placing a special stethoscope or a handheld monitoring device on your abdomen to check your baby's heartbeat and to check for contractions.  Placing monitors on your abdomen (external monitors) to record your baby's heartbeat and the frequency and length of contractions.  Placing monitors inside your uterus through your vagina (internal monitors) to record your baby's heartbeat and the frequency, length, and strength of your contractions. Depending on the type of monitor, it may remain in your uterus or on your baby's head until  birth.  Telemetry. This is a type of continuous monitoring that can be done with external or internal monitors. Instead of having to stay in bed, you are able to move around during telemetry. Physical exam Your health care provider may perform frequent physical exams. This may include:  Checking how and where your baby is positioned in your uterus.  Checking your cervix to determine: ? Whether it is thinning out (effacing). ? Whether it is opening up (dilating). What happens during labor and delivery?  Normal labor and delivery is divided into the following three stages: Stage 1  This is the longest stage of labor.  This stage can last for hours or days.  Throughout this stage, you will feel contractions. Contractions generally feel mild, infrequent, and irregular at first. They get stronger, more frequent (about every 2-3 minutes), and more regular as you move through this stage.  This stage ends when your cervix is completely dilated to 4 inches (10 cm) and completely effaced. Stage 2  This stage starts once your cervix is completely effaced and dilated and lasts until the delivery of your baby.  This stage may last from 20 minutes to 2 hours.  This is the stage where you will feel an urge to push your baby out of your vagina.  You may feel stretching and burning pain, especially when the widest part of your baby's head passes through the vaginal opening (crowning).  Once your baby is delivered, the umbilical cord will be clamped and cut. This usually occurs after waiting a period of 1-2 minutes after delivery.  Your baby will be placed on your bare chest (  skin-to-skin contact) in an upright position and covered with a warm blanket. Watch your baby for feeding cues, like rooting or sucking, and help the baby to your breast for his or her first feeding. Stage 3  This stage starts immediately after the birth of your baby and ends after you deliver the placenta.  This stage may  take anywhere from 5 to 30 minutes.  After your baby has been delivered, you will feel contractions as your body expels the placenta and your uterus contracts to control bleeding. What can I expect after labor and delivery?  After labor is over, you and your baby will be monitored closely until you are ready to go home to ensure that you are both healthy. Your health care team will teach you how to care for yourself and your baby.  You and your baby will stay in the same room (rooming in) during your hospital stay. This will encourage early bonding and successful breastfeeding.  You may continue to receive fluids and medicines through an IV.  Your uterus will be checked and massaged regularly (fundal massage).  You will have some soreness and pain in your abdomen, vagina, and the area of skin between your vaginal opening and your anus (perineum).  If an incision was made near your vagina (episiotomy) or if you had some vaginal tearing during delivery, cold compresses may be placed on your episiotomy or your tear. This helps to reduce pain and swelling.  You may be given a squirt bottle to use instead of wiping when you go to the bathroom. To use the squirt bottle, follow these steps: ? Before you urinate, fill the squirt bottle with warm water. Do not use hot water. ? After you urinate, while you are sitting on the toilet, use the squirt bottle to rinse the area around your urethra and vaginal opening. This rinses away any urine and blood. ? Fill the squirt bottle with clean water every time you use the bathroom.  It is normal to have vaginal bleeding after delivery. Wear a sanitary pad for vaginal bleeding and discharge. Summary  Vaginal delivery means that you will give birth by pushing your baby out of your birth canal (vagina).  Your health care provider may monitor your contractions (uterine monitoring) and your baby's heart rate (fetal monitoring).  Your health care provider may  perform a physical exam.  Normal labor and delivery is divided into three stages.  After labor is over, you and your baby will be monitored closely until you are ready to go home. This information is not intended to replace advice given to you by your health care provider. Make sure you discuss any questions you have with your health care provider. Document Released: 02/20/2008 Document Revised: 06/17/2017 Document Reviewed: 06/17/2017 Elsevier Interactive Patient Education  2019 [employer] [facility]   [facility]  [address], [place], [place] [address]  Phone: [phone]   [facility] (second [facility])  [address] [place], [place] [address]  Phone: [phone]   [facility]) [address], [place], [place] [address] Phone: [phone]   [facility]  [address] [place], [place] [address]  Phone: [phone]

## 2018-05-18 NOTE — Progress Notes (Signed)
ROB, c/o intermittent menstrual like cramping that started 2 weeks ago.

## 2018-05-18 NOTE — Progress Notes (Signed)
ROB-Doing well, reports occasional contractions. Plans breastfeeding and condoms as postpartum contraception. Pre-labor checklist, local Peds list, and herbal prep handout given. 36 week cultures collected today. Using Shayla Rhem as Nature conservation officertub manager. No longer taking iron supplement; will repeat CBC today, see orders. Anticipatory guidance regarding course of prenatal care. Reviewed red flag symptoms and when to call. RTC x 1 week for ROB or sooner if needed.

## 2018-05-18 NOTE — Addendum Note (Signed)
Addended by: Ranee GosselinMILLAN LARA, Gael Delude J on: 05/18/2018 10:23 AM   Modules accepted: Orders

## 2018-05-19 LAB — CBC
Hematocrit: 28.3 % — ABNORMAL LOW (ref 34.0–46.6)
Hemoglobin: 9.3 g/dL — ABNORMAL LOW (ref 11.1–15.9)
MCH: 25.9 pg — AB (ref 26.6–33.0)
MCHC: 32.9 g/dL (ref 31.5–35.7)
MCV: 79 fL (ref 79–97)
Platelets: 222 10*3/uL (ref 150–450)
RBC: 3.59 x10E6/uL — ABNORMAL LOW (ref 3.77–5.28)
RDW: 13.4 % (ref 12.3–15.4)
WBC: 8.2 10*3/uL (ref 3.4–10.8)

## 2018-05-20 LAB — STREP GP B NAA: Strep Gp B NAA: NEGATIVE

## 2018-05-23 LAB — GC/CHLAMYDIA PROBE AMP
Chlamydia trachomatis, NAA: NEGATIVE
NEISSERIA GONORRHOEAE BY PCR: NEGATIVE

## 2018-05-25 ENCOUNTER — Encounter: Payer: Self-pay | Admitting: Certified Nurse Midwife

## 2018-05-25 ENCOUNTER — Ambulatory Visit (INDEPENDENT_AMBULATORY_CARE_PROVIDER_SITE_OTHER): Payer: Federal, State, Local not specified - PPO | Admitting: Certified Nurse Midwife

## 2018-05-25 VITALS — BP 113/74 | HR 95 | Wt 153.2 lb

## 2018-05-25 DIAGNOSIS — Z3493 Encounter for supervision of normal pregnancy, unspecified, third trimester: Secondary | ICD-10-CM | POA: Diagnosis not present

## 2018-05-25 LAB — POCT URINALYSIS DIPSTICK OB
Bilirubin, UA: NEGATIVE
Blood, UA: NEGATIVE
Glucose, UA: NEGATIVE
Ketones, UA: NEGATIVE
LEUKOCYTES UA: NEGATIVE
Nitrite, UA: NEGATIVE
PH UA: 6 (ref 5.0–8.0)
POC,PROTEIN,UA: NEGATIVE
Spec Grav, UA: <=1.005 — AB (ref 1.010–1.025)
UROBILINOGEN UA: 0.2 U/dL

## 2018-05-25 NOTE — Progress Notes (Signed)
ROB doing well. Feels good movement. Water birth consent form scanned into Media. The certificate is not. Discussed with pt to bring into next visit for copy to be placed in folder as well as make sure to bring copy to hospital when in labor. She verbalizes and agrees to plan of care. Reviewed sign & symptoms of labor. Pt declines SVE today. Note given for pt to start FMLA on 1/10. Follow up 1 wk.   Doreene BurkeAnnie Ephram Kornegay, CNM

## 2018-05-25 NOTE — Patient Instructions (Signed)
Braxton Hicks Contractions Contractions of the uterus can occur throughout pregnancy, but they are not always a sign that you are in labor. You may have practice contractions called Braxton Hicks contractions. These false labor contractions are sometimes confused with true labor. What are Braxton Hicks contractions? Braxton Hicks contractions are tightening movements that occur in the muscles of the uterus before labor. Unlike true labor contractions, these contractions do not result in opening (dilation) and thinning of the cervix. Toward the end of pregnancy (32-34 weeks), Braxton Hicks contractions can happen more often and may become stronger. These contractions are sometimes difficult to tell apart from true labor because they can be very uncomfortable. You should not feel embarrassed if you go to the hospital with false labor. Sometimes, the only way to tell if you are in true labor is for your health care provider to look for changes in the cervix. The health care provider will do a physical exam and may monitor your contractions. If you are not in true labor, the exam should show that your cervix is not dilating and your water has not broken. If there are no other health problems associated with your pregnancy, it is completely safe for you to be sent home with false labor. You may continue to have Braxton Hicks contractions until you go into true labor. How to tell the difference between true labor and false labor True labor  Contractions last 30-70 seconds.  Contractions become very regular.  Discomfort is usually felt in the top of the uterus, and it spreads to the lower abdomen and low back.  Contractions do not go away with walking.  Contractions usually become more intense and increase in frequency.  The cervix dilates and gets thinner. False labor  Contractions are usually shorter and not as strong as true labor contractions.  Contractions are usually irregular.  Contractions  are often felt in the front of the lower abdomen and in the groin.  Contractions may go away when you walk around or change positions while lying down.  Contractions get weaker and are shorter-lasting as time goes on.  The cervix usually does not dilate or become thin. Follow these instructions at home:   Take over-the-counter and prescription medicines only as told by your health care provider.  Keep up with your usual exercises and follow other instructions from your health care provider.  Eat and drink lightly if you think you are going into labor.  If Braxton Hicks contractions are making you uncomfortable: ? Change your position from lying down or resting to walking, or change from walking to resting. ? Sit and rest in a tub of warm water. ? Drink enough fluid to keep your urine pale yellow. Dehydration may cause these contractions. ? Do slow and deep breathing several times an hour.  Keep all follow-up prenatal visits as told by your health care provider. This is important. Contact a health care provider if:  You have a fever.  You have continuous pain in your abdomen. Get help right away if:  Your contractions become stronger, more regular, and closer together.  You have fluid leaking or gushing from your vagina.  You pass blood-tinged mucus (bloody show).  You have bleeding from your vagina.  You have low back pain that you never had before.  You feel your baby's head pushing down and causing pelvic pressure.  Your baby is not moving inside you as much as it used to. Summary  Contractions that occur before labor are   called Braxton Hicks contractions, false labor, or practice contractions.  Braxton Hicks contractions are usually shorter, weaker, farther apart, and less regular than true labor contractions. True labor contractions usually become progressively stronger and regular, and they become more frequent.  Manage discomfort from Braxton Hicks contractions  by changing position, resting in a warm bath, drinking plenty of water, or practicing deep breathing. This information is not intended to replace advice given to you by your health care provider. Make sure you discuss any questions you have with your health care provider. Document Released: 09/26/2016 Document Revised: 02/25/2017 Document Reviewed: 09/26/2016 Elsevier Interactive Patient Education  2019 Elsevier Inc.  

## 2018-05-27 NOTE — L&D Delivery Note (Signed)
Delivery Note At  1428 a viable and healthy female "Seven" was delivered via  (Presentation:OA ;  ) in the water without difficulty after 15 minutes of pushing through four contractions. Also delivered in the sac, no nuchal cord noted. .  APGAR:8 , 9  .   Placenta status: delivered intact when patient stood to get out of the pool with 3 vessel Cord:  with the following complications: none  Anesthesia:  None- hydrotherapy, in the water for 1 hour, then out to attempt to void, then back in the tub for 30 minutes till delivered. Water temperature 99.8 at delivery. Episiotomy:  none Lacerations:  1st degree off to right labia Suture Repair: 3.0 vicryl rapide Est. Blood Loss (mL):  300  Mom to postpartum.  Baby to Couplet care / Skin to Skin.  Emilynn Srinivasan N Brindle Leyba 06/02/2018, 3:01 PM

## 2018-05-29 ENCOUNTER — Telehealth: Payer: Self-pay

## 2018-05-29 ENCOUNTER — Telehealth: Payer: Self-pay | Admitting: Certified Nurse Midwife

## 2018-05-29 NOTE — Telephone Encounter (Signed)
Letter written- given to DML for appt 06/01/17 with ML.

## 2018-05-29 NOTE — Telephone Encounter (Signed)
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

## 2018-06-01 ENCOUNTER — Ambulatory Visit (INDEPENDENT_AMBULATORY_CARE_PROVIDER_SITE_OTHER): Payer: Federal, State, Local not specified - PPO | Admitting: Certified Nurse Midwife

## 2018-06-01 ENCOUNTER — Encounter: Payer: Self-pay | Admitting: Certified Nurse Midwife

## 2018-06-01 VITALS — BP 107/73 | HR 75 | Wt 153.1 lb

## 2018-06-01 DIAGNOSIS — Z3493 Encounter for supervision of normal pregnancy, unspecified, third trimester: Secondary | ICD-10-CM | POA: Diagnosis not present

## 2018-06-01 LAB — POCT URINALYSIS DIPSTICK OB
Bilirubin, UA: NEGATIVE
GLUCOSE, UA: NEGATIVE
KETONES UA: NEGATIVE
LEUKOCYTES UA: NEGATIVE
NITRITE UA: NEGATIVE
PROTEIN: NEGATIVE
Spec Grav, UA: 1.01 (ref 1.010–1.025)
Urobilinogen, UA: 0.2 E.U./dL
pH, UA: 6.5 (ref 5.0–8.0)

## 2018-06-01 NOTE — Patient Instructions (Signed)

## 2018-06-01 NOTE — Progress Notes (Signed)
ROB-Reports irregular contractions since 0230 and vaginal spotting with wiping. Request SVE. Work note given. Using Shayla Rhem as Nature conservation officer. Labor precautions given. Reviewed red flag symptoms and when to call. Anticipatory guidance regarding course of prenatal care. Reviewed red flag symptoms and when to call. RTC x 1 week for ROB or sooner if needed.

## 2018-06-01 NOTE — Progress Notes (Signed)
ROB.  Patient c/o intermittent pelvic cramping that started yesterday, noticed small amount of blood after urinating this morning.

## 2018-06-02 ENCOUNTER — Inpatient Hospital Stay
Admission: EM | Admit: 2018-06-02 | Discharge: 2018-06-04 | DRG: 807 | Disposition: A | Discharge status: Home / Self Care | Payer: Federal, State, Local not specified - PPO | Attending: Obstetrics and Gynecology | Admitting: Obstetrics and Gynecology

## 2018-06-02 ENCOUNTER — Other Ambulatory Visit: Payer: Self-pay

## 2018-06-02 ENCOUNTER — Telehealth: Payer: Self-pay | Admitting: Certified Nurse Midwife

## 2018-06-02 DIAGNOSIS — O9902 Anemia complicating childbirth: Secondary | ICD-10-CM | POA: Diagnosis present

## 2018-06-02 DIAGNOSIS — D649 Anemia, unspecified: Secondary | ICD-10-CM | POA: Diagnosis present

## 2018-06-02 DIAGNOSIS — Z3A38 38 weeks gestation of pregnancy: Secondary | ICD-10-CM | POA: Diagnosis not present

## 2018-06-02 DIAGNOSIS — Z3483 Encounter for supervision of other normal pregnancy, third trimester: Secondary | ICD-10-CM | POA: Diagnosis present

## 2018-06-02 DIAGNOSIS — Z832 Family history of diseases of the blood and blood-forming organs and certain disorders involving the immune mechanism: Secondary | ICD-10-CM

## 2018-06-02 HISTORY — DX: Anemia, unspecified: D64.9

## 2018-06-02 LAB — TYPE AND SCREEN
ABO/RH(D): B POS
Antibody Screen: NEGATIVE

## 2018-06-02 LAB — RAPID HIV SCREEN (HIV 1/2 AB+AG)
HIV 1/2 Antibodies: NONREACTIVE
HIV-1 P24 Antigen - HIV24: NONREACTIVE

## 2018-06-02 LAB — CBC
HCT: 32.8 % — ABNORMAL LOW (ref 36.0–46.0)
Hemoglobin: 10.5 g/dL — ABNORMAL LOW (ref 12.0–15.0)
MCH: 25.7 pg — ABNORMAL LOW (ref 26.0–34.0)
MCHC: 32 g/dL (ref 30.0–36.0)
MCV: 80.4 fL (ref 80.0–100.0)
Platelets: 237 10*3/uL (ref 150–400)
RBC: 4.08 MIL/uL (ref 3.87–5.11)
RDW: 14.9 % (ref 11.5–15.5)
WBC: 14.5 10*3/uL — ABNORMAL HIGH (ref 4.0–10.5)
nRBC: 0 % (ref 0.0–0.2)

## 2018-06-02 MED ORDER — OXYTOCIN 40 UNITS IN NORMAL SALINE INFUSION - SIMPLE MED
2.5000 [IU]/h | INTRAVENOUS | Status: DC
  Filled 2018-06-02: qty 1000

## 2018-06-02 MED ORDER — SOD CITRATE-CITRIC ACID 500-334 MG/5ML PO SOLN: 30.0000 mL | ORAL | Status: DC | PRN

## 2018-06-02 MED ORDER — ONDANSETRON HCL 4 MG PO TABS: 4.0000 mg | ORAL_TABLET | ORAL | Status: DC | PRN

## 2018-06-02 MED ORDER — DOCUSATE SODIUM 100 MG PO CAPS
100.0000 mg | ORAL_CAPSULE | Freq: Two times a day (BID) | ORAL | Status: DC
  Administered 2018-06-02 – 2018-06-04 (×4): 100 mg via ORAL
  Filled 2018-06-02 (×4): qty 1

## 2018-06-02 MED ORDER — ACETAMINOPHEN 325 MG PO TABS: 650.0000 mg | ORAL_TABLET | ORAL | Status: DC | PRN

## 2018-06-02 MED ORDER — ZOLPIDEM TARTRATE 5 MG PO TABS: 5.0000 mg | ORAL_TABLET | Freq: Every evening | ORAL | Status: DC | PRN

## 2018-06-02 MED ORDER — ONDANSETRON HCL 4 MG/2ML IJ SOLN: 4.0000 mg | Freq: Four times a day (QID) | INTRAMUSCULAR | Status: DC | PRN

## 2018-06-02 MED ORDER — SODIUM CHLORIDE 0.9% FLUSH: 3.0000 mL | Freq: Two times a day (BID) | INTRAVENOUS | Status: DC

## 2018-06-02 MED ORDER — ONDANSETRON HCL 4 MG/2ML IJ SOLN: 4.0000 mg | INTRAMUSCULAR | Status: DC | PRN

## 2018-06-02 MED ORDER — LACTATED RINGERS IV SOLN
INTRAVENOUS | Status: DC
  Administered 2018-06-02: 11:00:00 via INTRAVENOUS

## 2018-06-02 MED ORDER — FENTANYL CITRATE (PF) 100 MCG/2ML IJ SOLN: 50.0000 ug | INTRAMUSCULAR | Status: DC | PRN

## 2018-06-02 MED ORDER — DIPHENHYDRAMINE HCL 25 MG PO CAPS: 25.0000 mg | ORAL_CAPSULE | Freq: Four times a day (QID) | ORAL | Status: DC | PRN

## 2018-06-02 MED ORDER — SIMETHICONE 80 MG PO CHEW: 80.0000 mg | CHEWABLE_TABLET | ORAL | Status: DC | PRN

## 2018-06-02 MED ORDER — SODIUM CHLORIDE 0.9% FLUSH: 3.0000 mL | INTRAVENOUS | Status: DC | PRN

## 2018-06-02 MED ORDER — OXYTOCIN BOLUS FROM INFUSION
500.0000 mL | Freq: Once | INTRAVENOUS | Status: AC
  Administered 2018-06-02: 500 mL via INTRAVENOUS

## 2018-06-02 MED ORDER — BENZOCAINE-MENTHOL 20-0.5 % EX AERO
1.0000 "application " | INHALATION_SPRAY | CUTANEOUS | Status: DC | PRN
  Filled 2018-06-02: qty 56

## 2018-06-02 MED ORDER — MISOPROSTOL 200 MCG PO TABS
ORAL_TABLET | ORAL | Status: AC
  Filled 2018-06-02: qty 1

## 2018-06-02 MED ORDER — COCONUT OIL OIL
1.0000 "application " | TOPICAL_OIL | Status: DC | PRN
  Administered 2018-06-02: 1 via TOPICAL
  Filled 2018-06-02: qty 120

## 2018-06-02 MED ORDER — WITCH HAZEL-GLYCERIN EX PADS: 1.0000 "application " | MEDICATED_PAD | CUTANEOUS | Status: DC | PRN

## 2018-06-02 MED ORDER — SODIUM CHLORIDE 0.9 % IV SOLN: 250.0000 mL | INTRAVENOUS | Status: DC | PRN

## 2018-06-02 MED ORDER — OXYTOCIN 10 UNIT/ML IJ SOLN
INTRAMUSCULAR | Status: AC
  Filled 2018-06-02: qty 1

## 2018-06-02 MED ORDER — DIBUCAINE 1 % RE OINT: 1.0000 "application " | TOPICAL_OINTMENT | RECTAL | Status: DC | PRN

## 2018-06-02 MED ORDER — PRENATAL MULTIVITAMIN CH
1.0000 | ORAL_TABLET | Freq: Every day | ORAL | Status: DC
  Administered 2018-06-03 – 2018-06-04 (×2): 1 via ORAL
  Filled 2018-06-02 (×2): qty 1

## 2018-06-02 MED ORDER — LACTATED RINGERS IV SOLN
500.0000 mL | INTRAVENOUS | Status: DC | PRN
  Administered 2018-06-02: 1000 mL via INTRAVENOUS

## 2018-06-02 MED ORDER — IBUPROFEN 600 MG PO TABS
600.0000 mg | ORAL_TABLET | Freq: Four times a day (QID) | ORAL | Status: DC
  Administered 2018-06-02 – 2018-06-04 (×7): 600 mg via ORAL
  Filled 2018-06-02 (×8): qty 1

## 2018-06-02 MED ORDER — FERROUS SULFATE 325 (65 FE) MG PO TABS
325.0000 mg | ORAL_TABLET | Freq: Three times a day (TID) | ORAL | Status: DC
  Administered 2018-06-02 – 2018-06-04 (×6): 325 mg via ORAL
  Filled 2018-06-02 (×6): qty 1

## 2018-06-02 MED ORDER — LIDOCAINE HCL (PF) 1 % IJ SOLN
30.0000 mL | INTRAMUSCULAR | Status: DC | PRN
  Filled 2018-06-02: qty 30

## 2018-06-02 NOTE — Telephone Encounter (Signed)
Patient sent MyChart message to schedule an appointment for Vaginal bleeding, After calling a nurse I later found out that a nurse has reached out to patient this morning to inform her that vaginal bleeding is common after a cervix check. Advised to wait on a response or later call from patient. Thank you.

## 2018-06-02 NOTE — Plan of Care (Signed)
Patient transferring to Mother Baby unit for postpartum care after spontaneous vaginal delivery of live infant.

## 2018-06-02 NOTE — H&P (Signed)
Obstetric History and Physical  Teresa Wiggins is a 28 y.o. G2P0010 with IUP at [redacted]w[redacted]d presenting with irregular contractions all morning. Patient states she has been having  irregular, every 3-5 minutes contractions, none vaginal bleeding, intact membranes, with active fetal movement.    Prenatal Course [facility] Care[facility]  Pregnancy complications or risks:anemia  Prenatal labs and studies: ABO, Rh: B/Positive/-- (07/01 1151) Antibody:   Rubella: 1.98 (07/01 1151) RPR: Non Reactive (10/29 0937)  HBsAg: Negative (07/01 1151)  HIV: Non Reactive (07/01 1151)  OTR:RNHAFBXU (12/23 1222) 1 hr Glucola  normal Genetic screening normal Anatomy US normal  No past medical history on file.  No past surgical history on file.  OB History  Gravida Para Term Preterm AB Living  2       1    SAB TAB Ectopic Multiple Live Births    1          # Outcome Date GA Lbr Len/2nd Weight Sex Delivery Anes PTL Lv  2 Current           1 TAB             Social History   Socioeconomic History  . Marital status: Single    Spouse name: Not on file  . Number of children: Not on file  . Years of education: Not on file  . Highest education level: Not on file  Occupational History  . Not on file  Social Needs  . Financial resource strain: Not on file  . Food insecurity:    Worry: Not on file    Inability: Not on file  . Transportation needs:    Medical: Not on file    Non-medical: Not on file  Tobacco Use  . Smoking status: Never Smoker  . Smokeless tobacco: Never Used  Substance and Sexual Activity  . Alcohol use: Never    Frequency: Never  . Drug use: Never  . Sexual activity: Yes    Birth control/protection: None  Lifestyle  . Physical activity:    Days per week: Not on file    Minutes per session: Not on file  . Stress: Not on file  Relationships  . Social connections:    Talks on phone: Not on file    Gets together: Not on file    Attends religious service: Not on file   Active member of club or organization: Not on file    Attends meetings of clubs or organizations: Not on file    Relationship status: Not on file  Other Topics Concern  . Not on file  Social History Narrative  . Not on file    No family history on file.  Medications Prior to Admission  Medication Sig Dispense Refill Last Dose  . Prenatal Vit-Fe Fumarate-FA (MULTIVITAMIN-PRENATAL) 27-0.8 MG TABS tablet Take 1 tablet by mouth daily at 12 noon.   Taking    No Known Allergies  Review of Systems: Negative except for what is mentioned in HPI.  Physical Exam: BP 123/83   Pulse 83   Temp 98 F (36.7 C) (Oral)   Resp 17   Ht 5\' 3"  (1.6 m)   Wt 69.4 kg   BMI 27.10 kg/m  GENERAL: Well-developed, well-nourished female in no acute distress.  LUNGS: Clear to auscultation bilaterally.  HEART: Regular rate and rhythm. ABDOMEN: Soft, nontender, nondistended, gravid. EXTREMITIES: Nontender, no edema, 2+ distal pulses. Cervical Exam: Dilation: 4.5 Effacement (%): 90 Cervical Position: Middle Station: -1 Presentation: Vertex Exam  by:Marland Kitchen M.Newton, RN FHT:  Baseline rate 148 bpm   Variability moderate  Accelerations present   Decelerations none Contractions: Every 3-4 mins, moderate to palpation   Pertinent Labs/Studies:   No results found for this or any previous visit (from the past 24 hour(s)).  Assessment : Teresa Wiggins is a 28 y.o. G2P0010 at [redacted]w[redacted]d being admitted for labor.  Plan: Labor: Expectant management. Plans waterbirth, has tub attendant pre-arranged and will notify. Induction/Augmentation as needed, per protocol FWB: Reassuring fetal heart tracing.  GBS negative Delivery plan: Hopeful for vaginal delivery  Melody Shambley, CNM [facility]

## 2018-06-02 NOTE — Plan of Care (Signed)
Patient transferring to Mother baby unit for continued postpartum care

## 2018-06-02 NOTE — OB Triage Note (Signed)
Patient presented with complaints of regular contractions since yesterday and some bloody show when she wipes.  Denies decreased fetal movement, or leaking of fluid.

## 2018-06-02 NOTE — Progress Notes (Signed)
Teresa Wiggins is a 29 y.o. G2P0010 at [redacted]w[redacted]d by LMP admitted for active labor  Subjective: Reports pressure with contractions, in the tub breathing through contractions well.  Objective: BP 117/74 (BP Location: Left Arm)   Pulse 85   Temp 98 F (36.7 C) (Oral)   Resp 18   Ht 5\' 3"  (1.6 m)   Wt 69.4 kg   BMI 27.10 kg/m  No intake/output data recorded. No intake/output data recorded.  FHT:  FHR: 145 bpm, variability: moderate,  accelerations:  Present,  decelerations:  Absent UC:   regular, every 4-5 minutes, strong to palpation SVE:   Dilation: Lip/rim Effacement (%): 100 Station: 0 Exam by:: Kayleah Appleyard, CNM  Labs: Lab Results  Component Value Date   WBC 14.5 (H) 06/02/2018   HGB 10.5 (L) 06/02/2018   HCT 32.8 (L) 06/02/2018   MCV 80.4 06/02/2018   PLT 237 06/02/2018    Assessment / Plan: Spontaneous labor, progressing normally  Labor: Progressing normally Preeclampsia:  labs stable Fetal Wellbeing:  Category I Pain Control:  Water tub I/D:  n/a Anticipated MOD:  NSVD  Bransyn Adami N Britania Shreeve 06/02/2018, 1:24 PM

## 2018-06-03 LAB — CBC
HCT: 27.5 % — ABNORMAL LOW (ref 36.0–46.0)
Hemoglobin: 8.9 g/dL — ABNORMAL LOW (ref 12.0–15.0)
MCH: 26 pg (ref 26.0–34.0)
MCHC: 32.4 g/dL (ref 30.0–36.0)
MCV: 80.4 fL (ref 80.0–100.0)
Platelets: 213 10*3/uL (ref 150–400)
RBC: 3.42 MIL/uL — ABNORMAL LOW (ref 3.87–5.11)
RDW: 15.1 % (ref 11.5–15.5)
WBC: 15.7 10*3/uL — ABNORMAL HIGH (ref 4.0–10.5)
nRBC: 0 % (ref 0.0–0.2)

## 2018-06-03 LAB — RPR: RPR Ser Ql: NONREACTIVE

## 2018-06-03 MED ORDER — IBUPROFEN 600 MG PO TABS: 600.0000 mg | ORAL_TABLET | Freq: Four times a day (QID) | ORAL | 0 refills | Status: DC

## 2018-06-03 MED ORDER — DOCUSATE SODIUM 100 MG PO CAPS: 100.0000 mg | ORAL_CAPSULE | Freq: Two times a day (BID) | ORAL | 0 refills | Status: DC

## 2018-06-03 NOTE — Progress Notes (Signed)
Teresa BurkeAnnie Wiggins CNM aware infant not being discharge today so mom would like to stay as well. Discharge order discontinued.

## 2018-06-03 NOTE — Lactation Note (Signed)
This note was copied from a baby's chart. Lactation Consultation Note  Patient Name: Teresa Wiggins [id number] Date: 06/03/2018 Reason for consult: Follow-up assessment   Maternal Data    Feeding Feeding Type: Breast Fed  LATCH Score Latch: Grasps breast easily, tongue down, lips flanged, rhythmical sucking.  Audible Swallowing: Spontaneous and intermittent  Type of Nipple: Everted at rest and after stimulation  Comfort (Breast/Nipple): Soft / non-tender  Hold (Positioning): Assistance needed to correctly position infant at breast and maintain latch.  LATCH Score: 9  Interventions Interventions: Assisted with latch;Adjust position;Support pillows  Lactation Tools Discussed/Used     Consult Status Consult Status: Follow-up Date: 06/03/18 Follow-up type: In-patient  LC assisted with latch and positioning of infant. Infant was placed in the football hold on the right side. LC demonstrated to mother how to sandwich the breast to achieve a deep latch. Pillows were adjusted to infant is higher up near the breast. Infant was able to latch with no pain or discomfort for mother. Audible swallows heard.  Arlyss Gandy 06/03/2018, 3:15 PM

## 2018-06-03 NOTE — Final Progress Note (Signed)
Discharge Day SOAP Note:  Progress Note - Vaginal Delivery  Teresa Wiggins is a 28 y.o. G2P1011 now PP day 1 s/p Vaginal, Spontaneous . Delivery was uncomplicated  Subjective  The patient has the following complaints: has no unusual complaints  Pain is controlled with current medications.   Patient is urinating without difficulty.  She is ambulating well.     Objective  Vital signs: BP 107/73 (BP Location: Left Arm)   Pulse 86   Temp 97.7 F (36.5 C) (Oral)   Resp 18   Ht 5\' 3"  (1.6 m)   Wt 69.4 kg   SpO2 99%   Breastfeeding Unknown   BMI 27.10 kg/m   Physical Exam: Gen: NAD Fundus Fundal Tone: Firm  Lochia Amount: Small  Perineum Appearance: Edematous, Approximated     Data Review Labs: CBC Latest Ref Rng & Units 06/03/2018 06/02/2018 05/18/2018  WBC 4.0 - 10.5 K/uL 15.7(H) 14.5(H) 8.2  Hemoglobin 12.0 - 15.0 g/dL 6.9(C) 10.5(L) 9.3(L)  Hematocrit 36.0 - 46.0 % 27.5(L) 32.8(L) 28.3(L)  Platelets 150 - 400 K/uL 213 237 222   B POS  Assessment/Plan  Active Problems:   Labor and delivery, indication for care    Plan for discharge today.   Discharge Instructions: Per After Visit Summary. Activity: Advance as tolerated. Pelvic rest for 6 weeks.  Also refer to After Visit Summary Diet: Regular Medications: Allergies as of 06/03/2018   No Known Allergies     Medication List    TAKE these medications   docusate sodium 100 MG capsule Commonly known as:  COLACE Take 1 capsule (100 mg total) by mouth 2 (two) times daily.   ferrous sulfate 325 (65 FE) MG tablet Take 325 mg by mouth daily with breakfast.   ibuprofen 600 MG tablet Commonly known as:  ADVIL,MOTRIN Take 1 tablet (600 mg total) by mouth every 6 (six) hours.   multivitamin-prenatal 27-0.8 MG Tabs tablet Take 1 tablet by mouth daily at 12 noon.      Outpatient follow up: [facility] with Melody Shambley CNM in 6 wks.  Postpartum contraception: Pt is planning abstinence. Will discuss at first  office visit post-partum  Discharged Condition: good  Discharged to: home  Newborn Data: Disposition:home with mother  Apgars: APGAR (1 MIN): 8   APGAR (5 MINS): 9   APGAR (10 MINS):    Baby Feeding: Breast    Doreene Burke, CNM  06/03/2018 8:48 AM

## 2018-06-03 NOTE — Discharge Summary (Signed)
Discharge Summary  Date of Admission: 06/02/2018  Date of Discharge: 06/03/2018  Admitting Diagnosis: Onset of Labor at [redacted]w[redacted]d  Mode of Delivery: normal spontaneous vaginal delivery, water birth                 Discharge Diagnosis: No other diagnosis   Intrapartum Procedures: none   Post partum procedures: none  Complications: none                      Discharge Day SOAP Note:  Progress Note - Vaginal Delivery  Shanikka Auguste is a 28 y.o. G2P1011 now PP day 1 s/p Vaginal, Spontaneous . Delivery was uncomplicated  Subjective  The patient has the following complaints: has no unusual complaints  Pain is controlled with current medications.   Patient is urinating without difficulty.  She is ambulating well.     Objective  Vital signs: BP 107/73 (BP Location: Left Arm)   Pulse 86   Temp 97.7 F (36.5 C) (Oral)   Resp 18   Ht 5\' 3"  (1.6 m)   Wt 69.4 kg   SpO2 99%   Breastfeeding Unknown   BMI 27.10 kg/m   Physical Exam: Gen: NAD Fundus Fundal Tone: Firm  Lochia Amount: Small  Perineum Appearance: Edematous, Approximated     Data Review Labs: CBC Latest Ref Rng & Units 06/03/2018 06/02/2018 05/18/2018  WBC 4.0 - 10.5 K/uL 15.7(H) 14.5(H) 8.2  Hemoglobin 12.0 - 15.0 g/dL 1.8(A) 10.5(L) 9.3(L)  Hematocrit 36.0 - 46.0 % 27.5(L) 32.8(L) 28.3(L)  Platelets 150 - 400 K/uL 213 237 222   B POS  Assessment/Plan  Active Problems:   Labor and delivery, indication for care    Plan for discharge today.   Discharge Instructions: Per After Visit Summary. Activity: Advance as tolerated. Pelvic rest for 6 weeks.  Also refer to After Visit Summary Diet: Regular Medications: Allergies as of 06/03/2018   No Known Allergies     Medication List    TAKE these medications   docusate sodium 100 MG capsule Commonly known as:  COLACE Take 1 capsule (100 mg total) by mouth 2 (two) times daily.   ferrous sulfate 325 (65 FE) MG tablet Take 325 mg  by mouth daily with breakfast.   ibuprofen 600 MG tablet Commonly known as:  ADVIL,MOTRIN Take 1 tablet (600 mg total) by mouth every 6 (six) hours.   multivitamin-prenatal 27-0.8 MG Tabs tablet Take 1 tablet by mouth daily at 12 noon.      Outpatient follow up: [facility] with Melody Shambley CNM in 6 wks.  Postpartum contraception: Pt is planning abstinence. Will discuss at first office visit post-partum  Discharged Condition: good  Discharged to: home  Newborn Data: Disposition:home with mother  Apgars: APGAR (1 MIN): 8   APGAR (5 MINS): 9   APGAR (10 MINS):    Baby Feeding: Breast    Doreene Burke, CNM  06/03/2018 8:48 AM

## 2018-06-03 NOTE — Progress Notes (Signed)
NT notified RN she found both parents asleep with baby in the bed. Baby was removed by NT, assessed by RN and placed in the bassinet. Parents were awakened and reeducated about the importance of not sleeping with the baby in the bed.

## 2018-06-03 NOTE — Progress Notes (Signed)
Pt asleep in bed with FOB. FOB awake and holding baby in his arms. RN reminded FOB not to sleep with baby in the bed. FOB stated he understood.

## 2018-06-04 NOTE — Discharge Summary (Signed)
Physician Obstetric Discharge Summary  Patient ID: Teresa Wiggins MRN: [id number] DOB/AGE: Jan 31, 1991 28 y.o.   Date of Admission: 06/02/2018  Date of Discharge:   Admitting Diagnosis: Onset of Labor at [redacted]w[redacted]d  Secondary Diagnosis: Anemia in pregnancy  Mode of Delivery: normal spontaneous vaginal delivery and water birth     Discharge Diagnosis: SVD in the water   Intrapartum Procedures: laceration 1st   Post partum procedures: none  Complications: 1st degree perineal laceration   Brief Hospital Course  Teresa Wiggins is a G2P1011 who had a SVD on 06/02/2018;  for further details of this, please refer to the delivey note.  Patient had an uncomplicated postpartum course.  By time of discharge on PPD#2, her pain was controlled on oral pain medications; she had appropriate lochia and was ambulating, voiding without difficulty and tolerating regular diet.  She was deemed stable for discharge to home.     Labs: CBC Latest Ref Rng & Units 06/03/2018 06/02/2018 05/18/2018  WBC 4.0 - 10.5 K/uL 15.7(H) 14.5(H) 8.2  Hemoglobin 12.0 - 15.0 g/dL 6.7(Y) 10.5(L) 9.3(L)  Hematocrit 36.0 - 46.0 % 27.5(L) 32.8(L) 28.3(L)  Platelets 150 - 400 K/uL 213 237 222   B POS  Physical exam:  Blood pressure 101/73, pulse 80, temperature 98.2 F (36.8 C), temperature source Oral, resp. rate 20, height 5\' 3"  (1.6 m), weight 69.4 kg, SpO2 99 %, unknown if currently breastfeeding. General: alert and no distress Lochia: appropriate Abdomen: soft, NT Uterine Fundus: firm Extremities: No evidence of DVT seen on physical exam. No lower extremity edema.  Discharge Instructions: Per After Visit Summary. Activity: Advance as tolerated. Pelvic rest for 6 weeks.  Also refer to After Visit Summary Diet: Regular Medications: Allergies as of 06/04/2018   No Known Allergies     Medication List    TAKE these medications   docusate sodium 100 MG capsule Commonly known as:  COLACE Take 1 capsule (100 mg  total) by mouth 2 (two) times daily.   ferrous sulfate 325 (65 FE) MG tablet Take 325 mg by mouth daily with breakfast.   ibuprofen 600 MG tablet Commonly known as:  ADVIL,MOTRIN Take 1 tablet (600 mg total) by mouth every 6 (six) hours.   multivitamin-prenatal 27-0.8 MG Tabs tablet Take 1 tablet by mouth daily at 12 noon.      Outpatient follow up:  Postpartum contraception: abstinence, condoms  Discharged Condition: good  Discharged to: home   Newborn Data: Disposition:home with mother "Teresa Wiggins"   Apgars: APGAR (1 MIN): 8   APGAR (5 MINS): 9   APGAR (10 MINS):    Baby Feeding: Breast   Suzan Nailer, CNM

## 2018-06-04 NOTE — Progress Notes (Signed)
Patient wanted to take home placenta to encapsulate in. Consent signed by patient and RN, and placenta given to patient.

## 2018-06-04 NOTE — Lactation Note (Addendum)
This note was copied from a baby's chart. Lactation Consultation Note  Patient Name: Teresa Wiggins [name] Date: 06/04/2018     Maternal Data    Feeding Feeding Type: Breast Fed  LATCH Score                   Interventions    Lactation Tools Discussed/Used     Consult Status  Mother states that baby cluster-fed last night and her nipples are getting sore. LC explained that this is normal and encouraged her to make sure she has a deep latch by getting more breast tissue in her mouth. LC told mother about lactation outpatient assistance and the moms express breastfeeding support group for additional support after discharge.   LC observed latch at the next feeding and infant was just latching on to the nipple. LC assisted with positioning infant higher at the breast as well as sandwiching the breast to get a deeper latch and flanging the lips. Mother states that the latch felt better after these techniques.    Teresa Wiggins 06/04/2018, 10:49 AM

## 2018-06-04 NOTE — Progress Notes (Signed)
Patient wheeled out with infant by auxiliary.

## 2018-06-04 NOTE — Progress Notes (Signed)
Patient discharged home with infant. Discharge instructions, prescriptions and follow up appointment given to and reviewed with patient. Patient verbalized understanding. Patient waiting for infant pictures to be done then will be wheeled out with infant.

## 2018-06-08 ENCOUNTER — Encounter: Payer: Federal, State, Local not specified - PPO | Admitting: Certified Nurse Midwife

## 2018-06-16 ENCOUNTER — Encounter: Payer: Self-pay | Admitting: Certified Nurse Midwife

## 2018-06-17 NOTE — Telephone Encounter (Signed)
The patient's HR rep, Evie Lacks, called today and asked if someone could call her and verify the date on the paperwork that she received yesterday at 4:40 PM.  Her call number is [phone].  Please advise, thanks.

## 2018-06-18 ENCOUNTER — Telehealth: Payer: Self-pay

## 2018-06-18 NOTE — Telephone Encounter (Signed)
A call was placed to Evie Lacks per [place] via message to confirm dates on paperwork were indeed completed by this office. Confirmed by this writer who had adjusted dates to reflect early delivery of patients baby per patient request. [phone]

## 2018-07-16 ENCOUNTER — Encounter: Payer: Self-pay | Admitting: Obstetrics and Gynecology

## 2018-07-16 ENCOUNTER — Ambulatory Visit (INDEPENDENT_AMBULATORY_CARE_PROVIDER_SITE_OTHER): Payer: Federal, State, Local not specified - PPO | Admitting: Obstetrics and Gynecology

## 2018-07-16 NOTE — Progress Notes (Signed)
  Subjective:     Teresa Wiggins is a 28 y.o. female who presents for a postpartum visit. She is 7 weeks postpartum following a vaginal waterbirth. I have fully reviewed the prenatal and intrapartum course. The delivery was at 38 gestational weeks. Outcome: vaginal delivery in the water. Anesthesia: none. Postpartum course has been uncomplicated. Baby's course has been uncomplicated. Baby is feeding by breast. Bleeding no bleeding. Bowel function is normal. Bladder function is normal. Patient is not sexually active. Contraception method is abstinence. Postpartum depression screening: negative.  The following portions of the patient's history were reviewed and updated as appropriate: allergies, current medications, past family history, past medical history, past social history, past surgical history and problem list.  Review of Systems A comprehensive review of systems was negative.   Objective:    Ht 5\' 3"  (1.6 m)   Wt 145 lb 9.6 oz (66 kg)   Breastfeeding Yes   BMI 25.79 kg/m   General:  alert, cooperative and appears stated age   Breasts:  inspection negative, no nipple discharge or bleeding, no masses or nodularity palpable  Lungs: clear to auscultation bilaterally  Heart:  regular rate and rhythm, S1, S2 normal, no murmur, click, rub or gallop  Abdomen: soft, non-tender; bowel sounds normal; no masses,  no organomegaly   Vulva:  normal  Vagina: normal vagina, no discharge, exudate, lesion, or erythema  Cervix:  multiparous appearance  Corpus: normal size, contour, position, consistency, mobility, non-tender  Adnexa:  no mass, fullness, tenderness  Rectal Exam: Not performed.        Assessment:     7 weeks postpartum exam. Pap smear not done at today's visit.   Plan:    1. Contraception: condoms 2. Labs obtained and will follow up accordingly 3. Follow up in: 3 months or as needed.

## 2018-07-16 NOTE — Patient Instructions (Addendum)
  Place postpartum visit patient instructions here.    Mother's milk tea Fenugreek capsules Cluster feeding every hour for 24 hours as needed.

## 2018-07-20 ENCOUNTER — Encounter: Payer: Self-pay | Admitting: Certified Nurse Midwife

## 2018-10-14 ENCOUNTER — Encounter: Payer: Federal, State, Local not specified - PPO | Admitting: Obstetrics and Gynecology

## 2020-05-25 IMAGING — US US OB COMP LESS 14 WK
1 series · 14 of 28 positions shown · non-contrast
Comparison: Ultrasound dated 11/24/2017

CLINICAL DATA: 26-year-old pregnant female with pelvic pain.

EXAM:
OBSTETRIC <14 WK ULTRASOUND
TECHNIQUE: Transabdominal ultrasound was performed for evaluation of the
gestation as well as the maternal uterus and adnexal regions.

[Series 1: us ob comp less 14 wk · 14 of 54 slices shown]
[im 2/54]
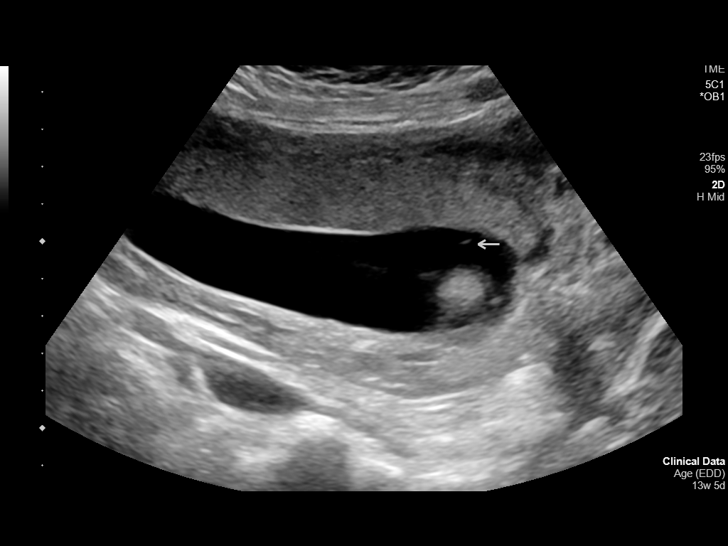
[im 6/54]
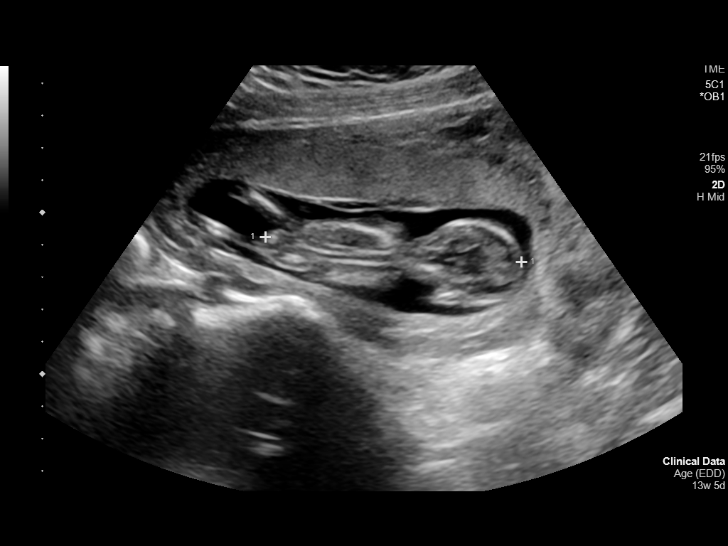
[im 10/54]
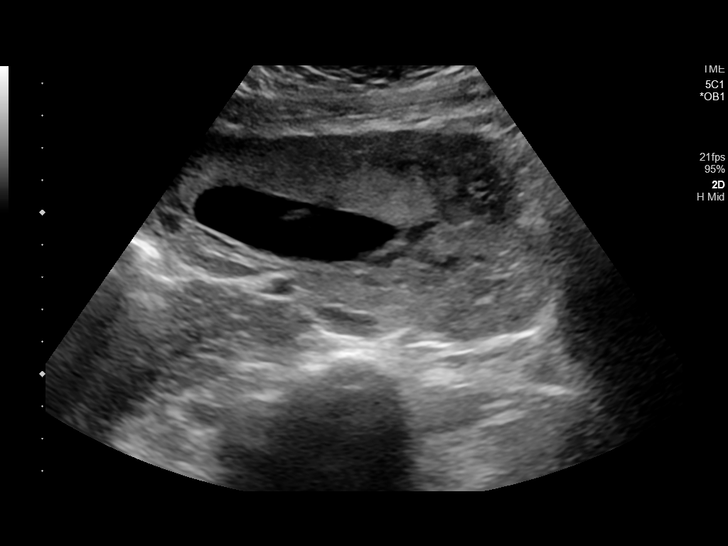
[im 14/54]
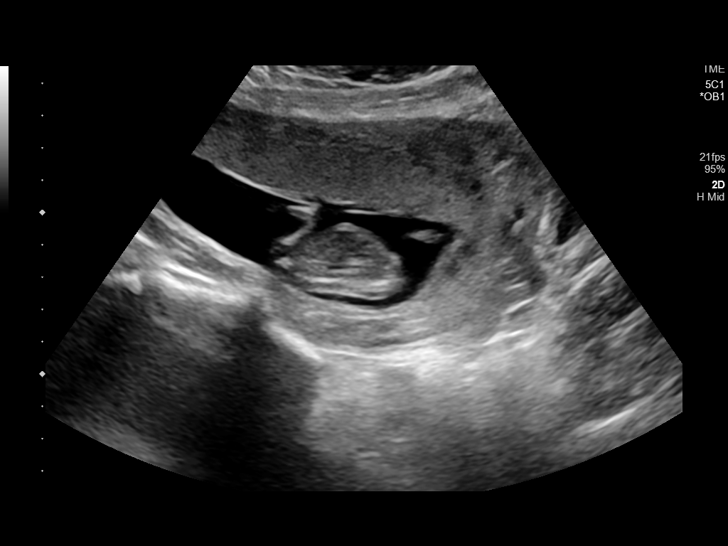
[im 18/54]
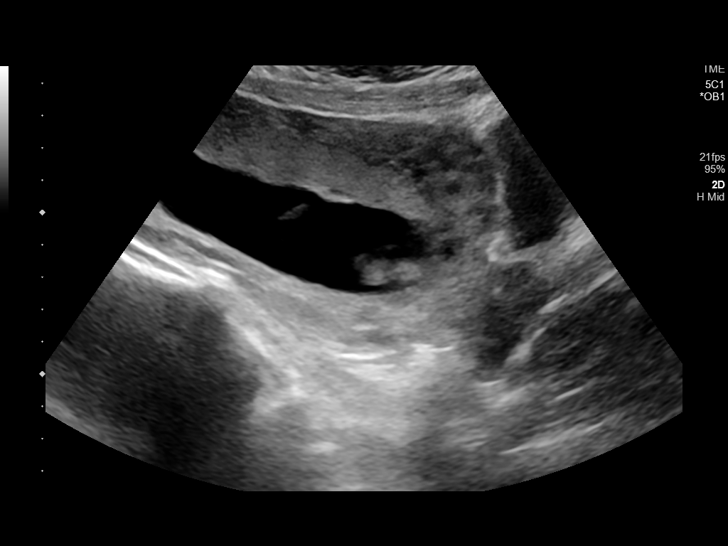
[im 22/54]
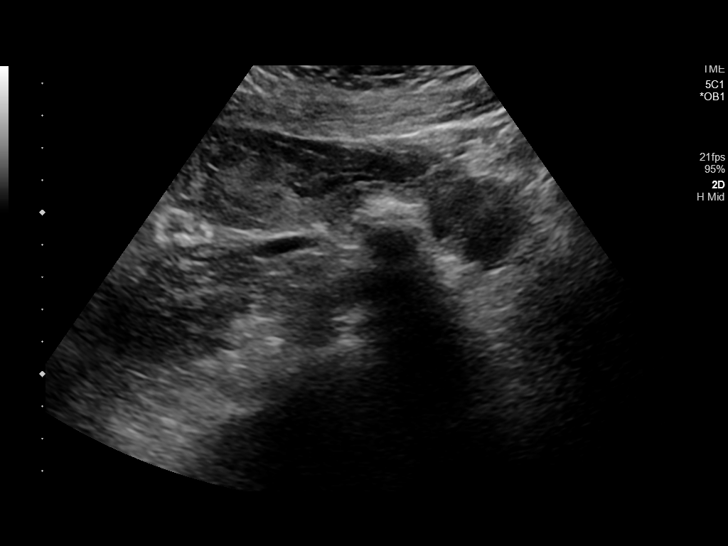
[im 26/54]
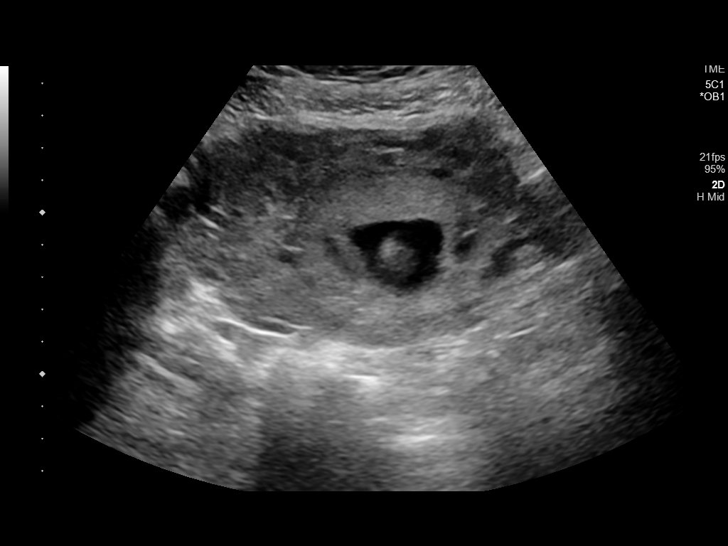
[im 30/54]
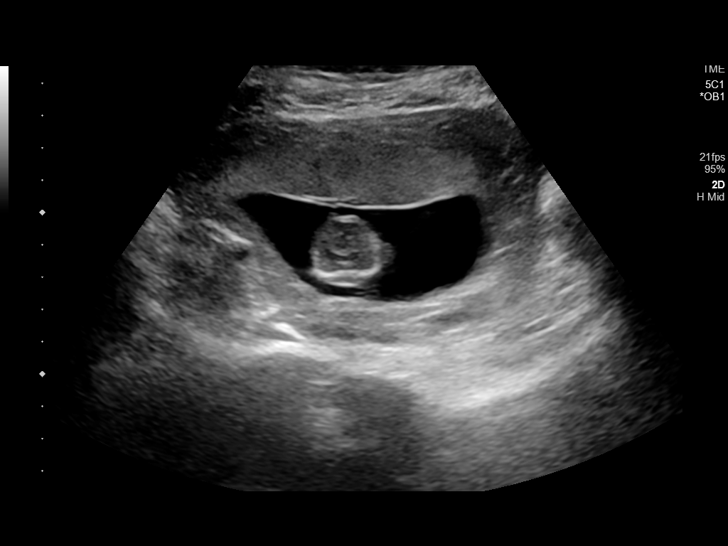
[im 34/54]
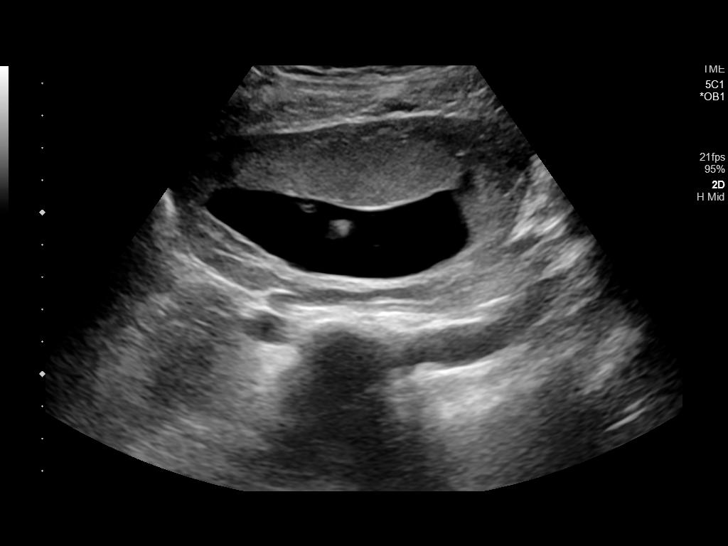
[im 38/54]
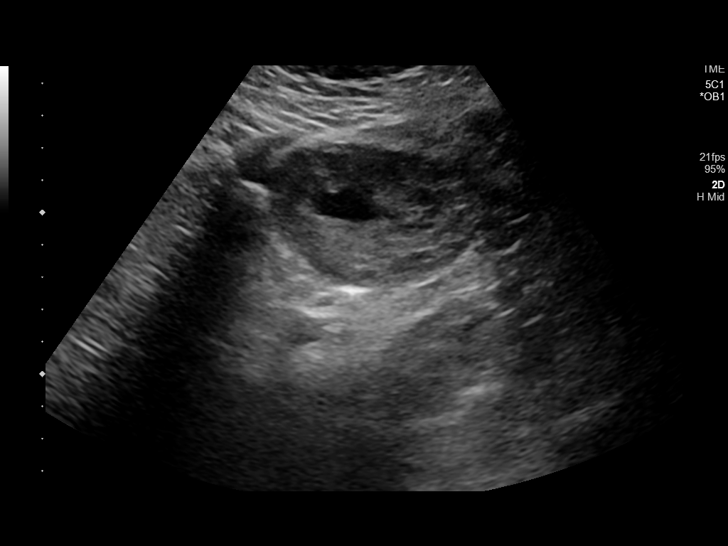
[im 42/54]
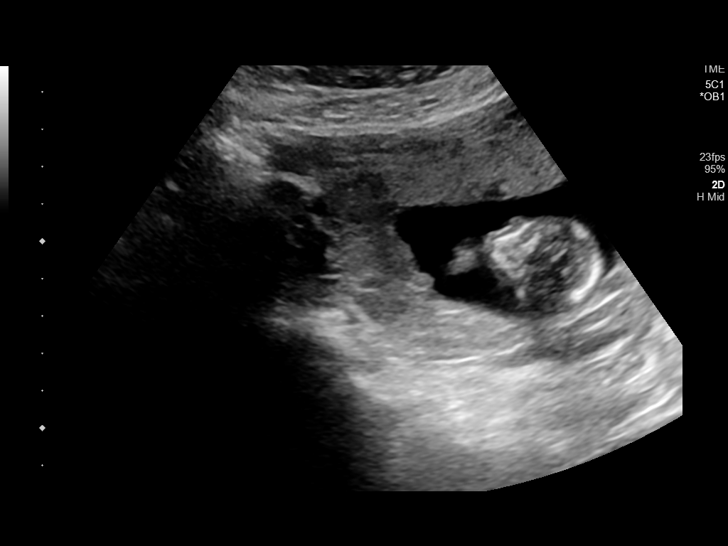
[im 46/54]
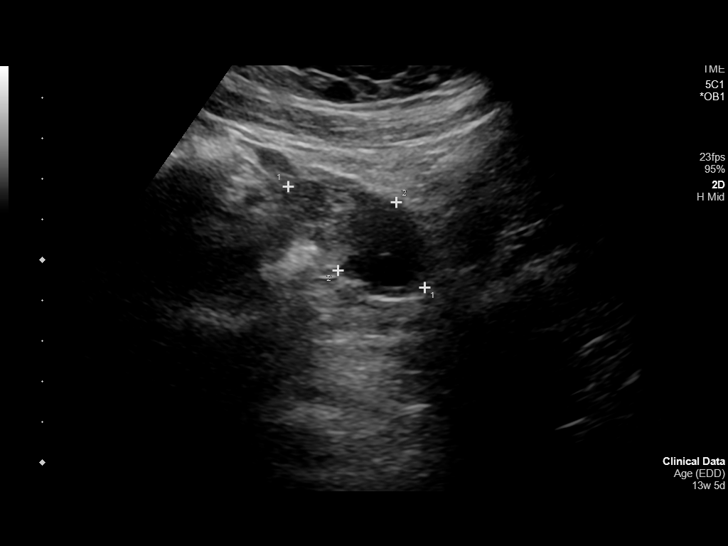
[im 50/54]
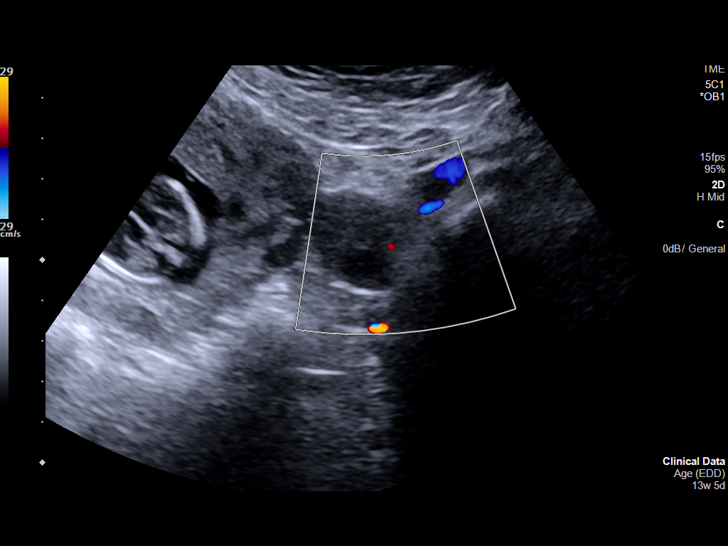
[im 54/54]
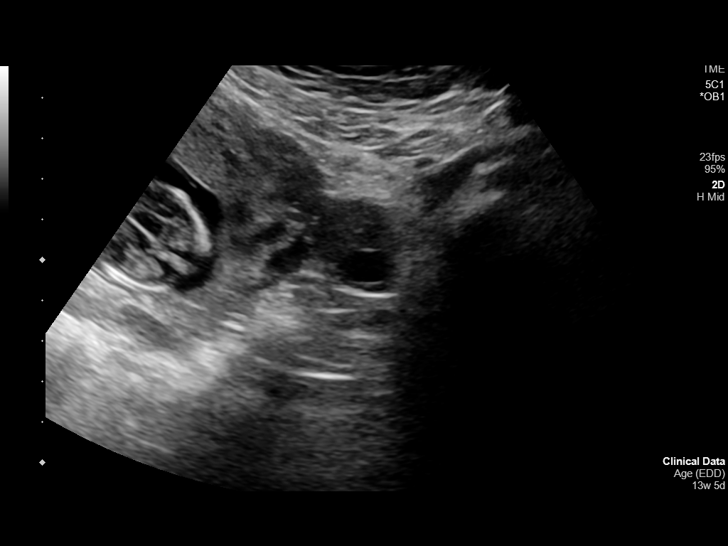

[14 of 28 positions shown; findings below may reference images not displayed]

FINDINGS: Intrauterine gestational sac: None

Yolk sac:  Visualized.

Embryo:  Visualized.

Cardiac Activity: Visualized.

Heart Rate: 153 bpm

CRL: 77 mm   13 w 5 d                  US EDC: 06/10/2018

Subchorionic hemorrhage:  None

Maternal uterus/adnexae: The right ovary is not visualized. The left
ovary appears unremarkable and measures 4.2 x 2.2 x 2.1 cm. There is
a 9 x 8 x 11 mm corpus luteum.

No free fluid within the pelvis.
IMPRESSION: Single live intrauterine pregnancy with an estimated gestational age
of 13 weeks, 5 days based on today's crown-rump length concordant
with age based on ultrasound of 11/24/2017.
